# Patient Record
Sex: Male | Born: 1940 | Race: Asian | Hispanic: No | Marital: Married | Smoking: Never smoker
Health system: Southern US, Community
[De-identification: ages and names within clinical notes are randomized; demographics above are authoritative.]

## PROBLEM LIST (undated history)

## (undated) DIAGNOSIS — R55 Syncope and collapse: Secondary | ICD-10-CM

## (undated) HISTORY — PX: APPENDECTOMY: SHX54

## (undated) HISTORY — PX: OTHER SURGICAL HISTORY: SHX169

## (undated) HISTORY — DX: Syncope and collapse: R55

---

## 2000-01-14 ENCOUNTER — Emergency Department (HOSPITAL_COMMUNITY): Admission: EM | Admit: 2000-01-14 | Discharge: 2000-01-14 | Payer: Self-pay | Admitting: Emergency Medicine

## 2000-01-17 ENCOUNTER — Ambulatory Visit (HOSPITAL_COMMUNITY): Admission: RE | Admit: 2000-01-17 | Discharge: 2000-01-17 | Payer: Self-pay | Admitting: Emergency Medicine

## 2000-01-23 ENCOUNTER — Ambulatory Visit (HOSPITAL_COMMUNITY): Admission: EM | Admit: 2000-01-23 | Discharge: 2000-01-23 | Payer: Self-pay | Admitting: Emergency Medicine

## 2000-01-28 ENCOUNTER — Emergency Department (HOSPITAL_COMMUNITY): Admission: EM | Admit: 2000-01-28 | Discharge: 2000-01-28 | Payer: Self-pay | Admitting: Emergency Medicine

## 2003-01-13 ENCOUNTER — Emergency Department (HOSPITAL_COMMUNITY): Admission: EM | Admit: 2003-01-13 | Discharge: 2003-01-14 | Payer: Self-pay | Admitting: Emergency Medicine

## 2003-01-13 ENCOUNTER — Encounter: Payer: Self-pay | Admitting: Emergency Medicine

## 2008-04-04 ENCOUNTER — Emergency Department (HOSPITAL_COMMUNITY): Admission: EM | Admit: 2008-04-04 | Discharge: 2008-04-04 | Payer: Self-pay | Admitting: Emergency Medicine

## 2010-11-05 ENCOUNTER — Emergency Department (HOSPITAL_COMMUNITY): Payer: Medicare Other

## 2010-11-05 ENCOUNTER — Ambulatory Visit (HOSPITAL_COMMUNITY)
Admission: EM | Admit: 2010-11-05 | Discharge: 2010-11-05 | Payer: Medicare Other | Attending: Orthopedic Surgery | Admitting: Orthopedic Surgery

## 2010-11-05 ENCOUNTER — Ambulatory Visit (HOSPITAL_COMMUNITY)
Admission: EM | Admit: 2010-11-05 | Discharge: 2010-11-05 | Disposition: A | Discharge status: Other Acute Inpatient Hospital | Payer: Medicare Other | Source: Home / Self Care | Attending: Emergency Medicine | Admitting: Emergency Medicine

## 2010-11-05 DIAGNOSIS — Z01818 Encounter for other preprocedural examination: Secondary | ICD-10-CM | POA: Insufficient documentation

## 2010-11-05 DIAGNOSIS — F411 Generalized anxiety disorder: Secondary | ICD-10-CM | POA: Insufficient documentation

## 2010-11-05 DIAGNOSIS — J449 Chronic obstructive pulmonary disease, unspecified: Secondary | ICD-10-CM | POA: Insufficient documentation

## 2010-11-05 DIAGNOSIS — W28XXXA Contact with powered lawn mower, initial encounter: Secondary | ICD-10-CM | POA: Insufficient documentation

## 2010-11-05 DIAGNOSIS — S62639B Displaced fracture of distal phalanx of unspecified finger, initial encounter for open fracture: Secondary | ICD-10-CM | POA: Insufficient documentation

## 2010-11-05 DIAGNOSIS — Y93H9 Activity, other involving exterior property and land maintenance, building and construction: Secondary | ICD-10-CM | POA: Insufficient documentation

## 2010-11-05 DIAGNOSIS — Z01812 Encounter for preprocedural laboratory examination: Secondary | ICD-10-CM | POA: Insufficient documentation

## 2010-11-05 DIAGNOSIS — IMO0002 Reserved for concepts with insufficient information to code with codable children: Secondary | ICD-10-CM | POA: Insufficient documentation

## 2010-11-05 DIAGNOSIS — Z79899 Other long term (current) drug therapy: Secondary | ICD-10-CM | POA: Insufficient documentation

## 2010-11-05 DIAGNOSIS — Y92009 Unspecified place in unspecified non-institutional (private) residence as the place of occurrence of the external cause: Secondary | ICD-10-CM | POA: Insufficient documentation

## 2010-11-05 DIAGNOSIS — I10 Essential (primary) hypertension: Secondary | ICD-10-CM | POA: Insufficient documentation

## 2010-11-05 DIAGNOSIS — J4489 Other specified chronic obstructive pulmonary disease: Secondary | ICD-10-CM | POA: Insufficient documentation

## 2010-11-05 DIAGNOSIS — K219 Gastro-esophageal reflux disease without esophagitis: Secondary | ICD-10-CM | POA: Insufficient documentation

## 2010-11-05 DIAGNOSIS — Z01810 Encounter for preprocedural cardiovascular examination: Secondary | ICD-10-CM | POA: Insufficient documentation

## 2010-11-05 DIAGNOSIS — Y998 Other external cause status: Secondary | ICD-10-CM | POA: Insufficient documentation

## 2010-11-05 LAB — BASIC METABOLIC PANEL
CO2: 25 mEq/L (ref 19–32)
Calcium: 9.5 mg/dL (ref 8.4–10.5)
Creatinine, Ser: 0.95 mg/dL (ref 0.50–1.35)
Glucose, Bld: 140 mg/dL — ABNORMAL HIGH (ref 70–99)

## 2010-11-05 LAB — CBC
HCT: 42.8 % (ref 39.0–52.0)
MCHC: 34.3 g/dL (ref 30.0–36.0)
MCV: 97.5 fL (ref 78.0–100.0)

## 2010-11-05 LAB — SURGICAL PCR SCREEN
MRSA, PCR: NEGATIVE
Staphylococcus aureus: NEGATIVE

## 2010-12-11 NOTE — Op Note (Signed)
  NAMEBASSEM, BERNASCONI                     ACCOUNT NO.:  [id number]  MEDICAL RECORD NO.:  [id number]  LOCATION:  [facility]                         FACILITY:  [facility]  PHYSICIAN:  Artist Pais. Harol Shabazz, M.D.DATE OF BIRTH:  30-Oct-1940  DATE OF PROCEDURE:  11/05/2010 DATE OF DISCHARGE:                              OPERATIVE REPORT   PREOPERATIVE DIAGNOSIS:  Open injuries, right long, ring and small fingers.  POSTOPERATIVE DIAGNOSIS:  Open injuries, right long, ring and small fingers.  PROCEDURE:  Revision amputation with a volar flaps, right long and right ring finger and repair of complex wound, right small finger.  SURGEON:  Artist Pais. Mina Marble, MD  ASSISTANT:  None.  ANESTHESIA:  General.  TOURNIQUET TIME:  26 minutes.  No complication.  No drains.  The patient was taken to the operating suite.  After induction of general anesthesia, right upper extremity was prepped and draped in sterile fashion.  An Esmarch was used to exsanguinate the limb. Tourniquet was inflated to 250 mmHg.  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  Rongeurs were used to obtain smooth edges and a contour tip to the middle phalanx, bony architecture of the long and ring finger.  Neurectomy was performed bilaterally and then volar flaps were raised to cover the exposed bone. This was sutured with 4-0 Vicryl Rapide for these 2 fingers.  The small finger had a small complex laceration volarly that was debrided and loosely closed with 4-0 Vicryl Rapide.  All 3 fingers were then dressed with Xeroform, 4x4s and Coban wrap.  The patient tolerated the procedure well and went to recovery room in stable fashion.     Artist Pais Mina Marble, M.D.     MAW/MEDQ  D:  11/05/2010  T:  11/06/2010  Job:  [id number]  Electronically Signed by Dairl Ponder M.D. on 12/11/2010 08:34:59 PM

## 2010-12-11 NOTE — Consult Note (Signed)
  Shane Melton, Shane Melton                     ACCOUNT NO.:  [id number]  MEDICAL RECORD NO.:  [id number]  LOCATION:  [facility]                         FACILITY:  [facility]  PHYSICIAN:  Artist Pais. Nakkia Mackiewicz, M.D.DATE OF BIRTH:  01-13-1941  DATE OF CONSULTATION:  11/05/2010 DATE OF DISCHARGE:                                CONSULTATION   REASON FOR CONSULTATION:  This is a 70 year old right-hand-dominant male who unfortunately got his right hand stuck under a moving lawn mower and presents today with open injuries to his long, ring and small fingers in the dominant right hand.  He is 69.  ALLERGIES:  He says he has no known drug allergies except possibly CODEINE.  MEDICATIONS:  He is not taking any current medications.  PAST MEDICAL/SURGICAL HISTORY:  No recent hospitalizations or surgery.  FAMILY HISTORY:  Noncontributory.  SOCIAL HISTORY:  Noncontributory.  This is obtained through an interpreter who is a friend of the family.  PHYSICAL EXAMINATION:  Reveals obvious grossly contaminated open injuries to the long, ring and small fingers with DIP level amputations to the long and ring.  Complex wound on the small finger.  X-rays confirmed this.  IMPRESSION:  This is a 70 year old male with open injuries on his dominant right hand to the long, ring and small fingers.  I discussed with him.  At this point in time, I recommend, since he has grossly contaminate open wounds, an emergent trip to the operating room.  [facility] OR will not be able to accommodate until 9 o'clock.  Therefore, the patient was going to be discharged from the [facility] Room since he has had no narcotic pain medication, just a local block and he will be driven in a private vehicle for a short stay at [facility].  We will go and take care of this in the operating room at [facility] where there is availability.     Artist Pais Mina Marble, M.D.     MAW/MEDQ  D:  11/05/2010  T:  11/06/2010   Job:  [id number]  Electronically Signed by Dairl Ponder M.D. on 12/11/2010 08:34:56 PM

## 2011-10-22 ENCOUNTER — Ambulatory Visit (INDEPENDENT_AMBULATORY_CARE_PROVIDER_SITE_OTHER): Payer: Medicare Other

## 2011-10-22 ENCOUNTER — Ambulatory Visit: Payer: Medicare Other

## 2011-10-22 ENCOUNTER — Ambulatory Visit (INDEPENDENT_AMBULATORY_CARE_PROVIDER_SITE_OTHER): Payer: Medicare Other | Admitting: Family Medicine

## 2011-10-22 VITALS — BP 126/77 | HR 63 | Temp 98.1°F | Resp 16 | Ht 64.5 in | Wt 146.6 lb

## 2011-10-22 DIAGNOSIS — Z789 Other specified health status: Secondary | ICD-10-CM

## 2011-10-22 DIAGNOSIS — L259 Unspecified contact dermatitis, unspecified cause: Secondary | ICD-10-CM

## 2011-10-22 DIAGNOSIS — R05 Cough: Secondary | ICD-10-CM

## 2011-10-22 DIAGNOSIS — L309 Dermatitis, unspecified: Secondary | ICD-10-CM

## 2011-10-22 DIAGNOSIS — Z719 Counseling, unspecified: Secondary | ICD-10-CM

## 2011-10-22 DIAGNOSIS — Z609 Problem related to social environment, unspecified: Secondary | ICD-10-CM

## 2011-10-22 NOTE — Progress Notes (Signed)
   Patient Name: Shane Melton Date of Birth: 05-08-1941 Medical Record Number: [id number] Gender: male Date of Encounter: 10/22/2011  History of Present Illness:  Shane Melton is a 71 y.o. very pleasant male patient who presents with the following:  He would like to have a "diabetes test."  He does not speak [place]- family interprets for him today.   He has also had a cough for 2 or 3 years.  He has not tried any medications for this.  He sometimes coughs up phlegm.  He is concerned that he may have "neck cancer-" his mother had some sort of head and neck cancer.  He does not have any fever or other symptoms.  He also has a history of eczema on his legs and would like to see a dermatologist.    There is no problem list on file for this patient.  No past medical history on file. No past surgical history on file. History  Substance Use Topics  . Smoking status: Never Smoker   . Smokeless tobacco: Not on file  . Alcohol Use: Not on file   No family history on file. No Known Allergies  Medication list has been reviewed and updated.  Prior to Admission medications   Not on File    Review of Systems:  As per HPI- otherwise negative.   Physical Examination: Filed Vitals:   10/22/11 1222  BP: 126/77  Pulse: 63  Temp: 98.1 F (36.7 C)  Resp: 16   Filed Vitals:   10/22/11 1222  Height: 5' 4.5" (1.638 m)  Weight: 146 lb 9.6 oz (66.497 kg)   Body mass index is 24.78 kg/(m^2).  GEN: WDWN, NAD, Non-toxic, A & O x 3 HEENT: Atraumatic, Normocephalic. Neck supple. No masses, No LAD.  PEERL, TM and oropharynx wnl.   Ears and Nose: No external deformity. CV: RRR, No M/G/R. No JVD. No thrill. No extra heart sounds. PULM: CTA B, no wheezes, crackles, rhonchi. No retractions. No resp. distress. No accessory muscle use. ABD: S, NT, ND, +BS. No rebound. No HSM. EXTR: No c/c/e NEURO Normal gait.  PSYCH: Normally interactive. Conversant. Not depressed or anxious appearing.  Calm  demeanor.   [facility] reading (PRIMARY) by  Dr. Patsy Lager.  CXR: blunting left costophrenic angle vs. elevation of left hemidiaphragm   Results for orders placed in visit on 10/22/11  POCT GLYCOSYLATED HEMOGLOBIN (HGB A1C)      Component Value Range   Hemoglobin A1C 4.9      Assessment and Plan: 1. Language Barrier    2. Cough  DG Chest 2 View  3. Counseling NOS  POCT glycosylated hemoglobin (Hb A1C)  4. Eczema  Ambulatory referral to Dermatology   Reassured that he does not have DM.  I am not sure what type of head/ neck cancer his mother had, but these are generally not genetic and she should not be at increased risk.  He has never used tobacco.   Await over-read of CXR.  Suspect that he may have allergies causing his cough and suggest trial or OTC claritin.  Will refer to dermatology   Abbe Amsterdam, MD 10/22/2011 12:27 PM nd

## 2011-10-23 ENCOUNTER — Encounter: Payer: Self-pay | Admitting: Family Medicine

## 2011-11-13 ENCOUNTER — Ambulatory Visit (INDEPENDENT_AMBULATORY_CARE_PROVIDER_SITE_OTHER): Payer: Medicare Other | Admitting: Emergency Medicine

## 2011-11-13 VITALS — BP 128/78 | HR 67 | Temp 97.3°F | Resp 16 | Ht 64.5 in | Wt 151.2 lb

## 2011-11-13 DIAGNOSIS — Z23 Encounter for immunization: Secondary | ICD-10-CM

## 2011-11-13 DIAGNOSIS — Z7184 Encounter for health counseling related to travel: Secondary | ICD-10-CM

## 2011-11-13 DIAGNOSIS — Z7189 Other specified counseling: Secondary | ICD-10-CM

## 2011-11-13 MED ORDER — IMMUNE GLOBULIN (HUMAN) IM INJ
0.0200 mL/kg | INJECTION | Freq: Once | INTRAMUSCULAR | Status: AC
  Administered 2011-11-13: 1.4 mL via INTRAMUSCULAR

## 2011-11-13 MED ORDER — TYPHOID VACCINE PO CPDR: 1.0000 | DELAYED_RELEASE_CAPSULE | ORAL | Status: AC

## 2011-11-13 MED ORDER — TETANUS TOXOID ADSORBED 5 LFU IM SOLN
0.5000 mL | Freq: Once | INTRAMUSCULAR | Status: AC
  Administered 2011-11-13: 0.5 mL via INTRAMUSCULAR

## 2011-11-13 MED ORDER — ATOVAQUONE-PROGUANIL HCL 250-100 MG PO TABS: 1.0000 | ORAL_TABLET | Freq: Every day | ORAL | Status: DC

## 2011-11-13 NOTE — Progress Notes (Signed)
    Patient Name: Shane Melton Date of Birth: 09/03/1940 Medical Record Number: [id number] Gender: male Date of Encounter: 11/13/2011  History of Present Illness:  Shane Melton is a 71 y.o. 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   There is no problem list on file for this patient.  No past medical history on file. No past surgical history on file. History  Substance Use Topics  . Smoking status: Never Smoker   . Smokeless tobacco: Not on file  . Alcohol Use: Not on file   No family history on file. No Known Allergies  Medication list has been reviewed and updated.  Prior to Admission medications   Not on File    Review of Systems:  As per HPI, otherwise negative.    Physical Examination: Filed Vitals:   11/13/11 0811  BP: 128/78  Pulse: 67  Temp: 97.3 F (36.3 C)  Resp: 16   Filed Vitals:   11/13/11 0811  Height: 5' 4.5" (1.638 m)  Weight: 151 lb 3.2 oz (68.584 kg)   Body mass index is 25.55 kg/(m^2). Ideal Body Weight: Weight in (lb) to have BMI = 25: 147.6    GEN: WDWN, NAD, Non-toxic, Alert & Oriented x 3 HEENT: Atraumatic, Normocephalic.  Ears and Nose: No external deformity. EXTR: No clubbing/cyanosis/edema NEURO: Normal gait.  PSYCH: Normally interactive. Conversant. Not depressed or anxious appearing.  Calm demeanor.    Assessment and Plan: Travel counseling and immunizations  Carmelina Dane, MD

## 2013-06-29 ENCOUNTER — Ambulatory Visit (INDEPENDENT_AMBULATORY_CARE_PROVIDER_SITE_OTHER): Payer: Medicare Other | Admitting: Internal Medicine

## 2013-06-29 VITALS — BP 108/60 | HR 73 | Temp 97.7°F | Resp 16 | Ht 64.5 in | Wt 154.0 lb

## 2013-06-29 DIAGNOSIS — L309 Dermatitis, unspecified: Secondary | ICD-10-CM | POA: Insufficient documentation

## 2013-06-29 DIAGNOSIS — L259 Unspecified contact dermatitis, unspecified cause: Secondary | ICD-10-CM

## 2013-06-29 MED ORDER — PREDNISONE 20 MG PO TABS: ORAL_TABLET | ORAL | Status: DC

## 2013-06-29 MED ORDER — TRIAMCINOLONE 0.1 % CREAM:EUCERIN CREAM 1:1: 1.0000 "application " | TOPICAL_CREAM | Freq: Two times a day (BID) | CUTANEOUS | Status: DC

## 2013-06-29 MED ORDER — CETIRIZINE HCL 10 MG PO TABS: 10.0000 mg | ORAL_TABLET | Freq: Every day | ORAL | Status: DC

## 2013-06-29 NOTE — Progress Notes (Deleted)
   Subjective:    Patient ID: Shane BaileyHee Young Melton, male    DOB: 1940-06-24, 73 y.o.   MRN: [id number]  HPI    Review of Systems     Objective:   Physical Exam        Assessment & Plan:

## 2013-06-29 NOTE — Patient Instructions (Signed)
Eczema Eczema, also called atopic dermatitis, is a skin disorder that causes inflammation of the skin. It causes a red rash and dry, scaly skin. The skin becomes very itchy. Eczema is generally worse during the cooler winter months and often improves with the warmth of summer. Eczema usually starts showing signs in infancy. Some children outgrow eczema, but it may last through adulthood.  CAUSES  The exact cause of eczema is not known, but it appears to run in families. People with eczema often have a family history of eczema, allergies, asthma, or hay fever. Eczema is not contagious. Flare-ups of the condition may be caused by:   Contact with something you are sensitive or allergic to.   Stress. SIGNS AND SYMPTOMS  Dry, scaly skin.   Red, itchy rash.   Itchiness. This may occur before the skin rash and may be very intense.  DIAGNOSIS  The diagnosis of eczema is usually made based on symptoms and medical history. TREATMENT  Eczema cannot be cured, but symptoms usually can be controlled with treatment and other strategies. A treatment plan might include:  Controlling the itching and scratching.   Use over-the-counter antihistamines as directed for itching. This is especially useful at night when the itching tends to be worse.   Use over-the-counter steroid creams as directed for itching.   Avoid scratching. Scratching makes the rash and itching worse. It may also result in a skin infection (impetigo) due to a break in the skin caused by scratching.   Keeping the skin well moisturized with creams every day. This will seal in moisture and help prevent dryness. Lotions that contain alcohol and water should be avoided because they can dry the skin.   Limiting exposure to things that you are sensitive or allergic to (allergens).   Recognizing situations that cause stress.   Developing a plan to manage stress.  HOME CARE INSTRUCTIONS   Only take over-the-counter or  prescription medicines as directed by your health care provider.   Do not use anything on the skin without checking with your health care provider.   Keep baths or showers short (5 minutes) in warm (not hot) water. Use mild cleansers for bathing. These should be unscented. You may add nonperfumed bath oil to the bath water. It is best to avoid soap and bubble bath.   Immediately after a bath or shower, when the skin is still damp, apply a moisturizing ointment to the entire body. This ointment should be a petroleum ointment. This will seal in moisture and help prevent dryness. The thicker the ointment, the better. These should be unscented.   Keep fingernails cut short. Children with eczema may need to wear soft gloves or mittens at night after applying an ointment.   Dress in clothes made of cotton or cotton blends. Dress lightly, because heat increases itching.   A child with eczema should stay away from anyone with fever blisters or cold sores. The virus that causes fever blisters (herpes simplex) can cause a serious skin infection in children with eczema. SEEK MEDICAL CARE IF:   Your itching interferes with sleep.   Your rash gets worse or is not better within 1 week after starting treatment.   You see pus or soft yellow scabs in the rash area.   You have a fever.   You have a rash flare-up after contact with someone who has fever blisters.  Document Released: 05/03/2000 Document Revised: 02/24/2013 Document Reviewed: 12/07/2012 Exit[employer] Patient Information 2014 [employer].  

## 2013-06-29 NOTE — Progress Notes (Signed)
   Subjective:    Patient ID: Shane Melton, male    DOB: December 30, 1940, 73 y.o.   MRN: [id number] This chart was scribed for Shane Siaobert Alga Southall, MD by Danella Maiersaroline Early, ED Scribe. This patient was seen in room 2 and the patient's care was started at 4:11 PM.  Chief Complaint  Patient presents with  . itchy rash all over body since Dec. 2014    HPI HPI Comments: Shane BaileyHee Young Brightbill is a 73 y.o. male who presents to the [facility] complaining of itchy rash to bilateral arms, back, abdomen,and legs onset 2-3 years ago that has worsened since December. Pt states the rash becomes warm to touch, especially at night. Wife states it worsens every winter. Pt has been applying lotion every 12 hours. He denies rash to the face or behind the ears. He is otherwise healthy. He is not on any medications currently. Pt has lived in [place] 20 years. He denies seasonal allergies. Wife denies contacts with similar rash.   History reviewed. No pertinent past medical history.  Current Outpatient Prescriptions on File Prior to Visit  Medication Sig Dispense Refill    none         No Known Allergies   Review of Systems   no current other symptoms     Objective:   Physical Exam  Nursing note and vitals reviewed. Constitutional: He is oriented to person, place, and time. He appears well-developed and well-nourished. No distress.  HENT:  Head: Normocephalic and atraumatic.  Eyes: EOM are normal.  Neck: Neck supple.  Cardiovascular: Normal rate.   Pulmonary/Chest: Effort normal. No respiratory distress.  Musculoskeletal: Normal range of motion.  Neurological: He is alert and oriented to person, place, and time.  Skin: Skin is warm and dry.  Generalized dryness with multiple areas of excoriation. No nodular lesions sugggest of scabies. No vesiculation and no cellulitis. The lesions spare the palms and soles --at their worst on the extremities, dorsal hands   Psychiatric: He has a normal  mood and affect. His behavior is normal.    Filed Vitals:   06/29/13 1525  BP: 108/60  Pulse: 73  Temp: 97.7 F (36.5 C)  TempSrc: Oral  Resp: 16  Height: 5' 4.5" (1.638 m)  Weight: 154 lb (69.854 kg)  SpO2: 97%         Assessment & Plan:  Eczema  Meds ordered this encounter  Medications  . predniSONE (DELTASONE) 20 MG tablet    Sig: 3/3/2/2/1/1 single daily dose for 6 days    Dispense:  12 tablet    Refill:  0  . cetirizine (ZYRTEC) 10 MG tablet    Sig: Take 1 tablet (10 mg total) by mouth daily.    Dispense:  15 tablet    Refill:  0  . Triamcinolone Acetonide (TRIAMCINOLONE 0.1 % CREAM : EUCERIN) CREA    Sig: Apply 1 application topically 2 (two) times daily. For 1 month    Dispense:  1 each    Refill:  1   Ho given Followup one month if not well to consider biopsy of area I have completed the patient encounter in its entirety as documented by the scribe, with editing by me where necessary. Casimiro Lienhard P. Merla Richesoolittle, M.D.

## 2013-07-28 ENCOUNTER — Ambulatory Visit: Payer: Medicare Other

## 2013-07-28 ENCOUNTER — Other Ambulatory Visit: Payer: Self-pay | Admitting: Family Medicine

## 2013-07-28 ENCOUNTER — Ambulatory Visit (INDEPENDENT_AMBULATORY_CARE_PROVIDER_SITE_OTHER): Payer: Medicare Other | Admitting: Family Medicine

## 2013-07-28 VITALS — BP 120/74 | HR 60 | Temp 97.9°F | Resp 16 | Ht 65.5 in

## 2013-07-28 DIAGNOSIS — R0789 Other chest pain: Secondary | ICD-10-CM

## 2013-07-28 DIAGNOSIS — M542 Cervicalgia: Secondary | ICD-10-CM

## 2013-07-28 DIAGNOSIS — L309 Dermatitis, unspecified: Secondary | ICD-10-CM

## 2013-07-28 DIAGNOSIS — R071 Chest pain on breathing: Secondary | ICD-10-CM

## 2013-07-28 LAB — POCT CBC
GRANULOCYTE PERCENT: 44.6 % (ref 37–80)
HEMATOCRIT: 46.7 % (ref 43.5–53.7)
HEMOGLOBIN: 15.3 g/dL (ref 14.1–18.1)
Lymph, poc: 2.2 (ref 0.6–3.4)
MCH: 34.2 pg — AB (ref 27–31.2)
MCHC: 32.8 g/dL (ref 31.8–35.4)
MCV: 104.3 fL — AB (ref 80–97)
MID (cbc): 0.4 (ref 0–0.9)
MPV: 8.8 fL (ref 0–99.8)
PLATELET COUNT, POC: 227 10*3/uL (ref 142–424)
POC Granulocyte: 2.1 (ref 2–6.9)
POC LYMPH PERCENT: 47.7 %L (ref 10–50)
POC MID %: 7.7 %M (ref 0–12)
RBC: 4.48 M/uL — AB (ref 4.69–6.13)
RDW, POC: 13.3 %
WBC: 4.7 10*3/uL (ref 4.6–10.2)

## 2013-07-28 MED ORDER — NAPROXEN 500 MG PO TABS: 500.0000 mg | ORAL_TABLET | Freq: Two times a day (BID) | ORAL | Status: DC

## 2013-07-28 MED ORDER — BETAMETHASONE DIPROPIONATE 0.05 % EX LOTN: TOPICAL_LOTION | Freq: Two times a day (BID) | CUTANEOUS | Status: DC

## 2013-07-28 MED ORDER — TRAMADOL HCL 50 MG PO TABS: 50.0000 mg | ORAL_TABLET | Freq: Three times a day (TID) | ORAL | Status: DC | PRN

## 2013-07-28 MED ORDER — METHYLPREDNISOLONE ACETATE 80 MG/ML IJ SUSP
80.0000 mg | Freq: Once | INTRAMUSCULAR | Status: AC
  Administered 2013-07-28: 80 mg via INTRAMUSCULAR

## 2013-07-28 NOTE — Patient Instructions (Addendum)
Use a little mineral oral on the skin after bathing.  Use the betamethasone lotion on the areas of rash once or twice daily. Only use a small amount.  The Depo-Medrol injection that we gave you will help calm down the inflammation in your ribs and neck, and will help the rash.  Take naproxen 500 mg one twice daily at breakfast and supper for pain and inflammation in the chest wall  Take tramadol only when needed for severe pain. Do not exceed one every 8 hours.  If worse return for recheck  Costochondritis Costochondritis, sometimes called Tietze syndrome, is a swelling and irritation (inflammation) of the tissue (cartilage) that connects your ribs with your breastbone (sternum). It causes pain in the chest and rib area. Costochondritis usually goes away on its own over time. It can take up to 6 weeks or longer to get better, especially if you are unable to limit your activities. CAUSES  Some cases of costochondritis have no known cause. Possible causes include:  Injury (trauma).  Exercise or activity such as lifting.  Severe coughing. SIGNS AND SYMPTOMS  Pain and tenderness in the chest and rib area.  Pain that gets worse when coughing or taking deep breaths.  Pain that gets worse with specific movements. DIAGNOSIS  Your health care provider will do a physical exam and ask about your symptoms. Chest X-rays or other tests may be done to rule out other problems. TREATMENT  Costochondritis usually goes away on its own over time. Your health care provider may prescribe medicine to help relieve pain. HOME CARE INSTRUCTIONS   Avoid exhausting physical activity. Try not to strain your ribs during normal activity. This would include any activities using chest, abdominal, and side muscles, especially if heavy weights are used.  Apply ice to the affected area for the first 2 days after the pain begins.  Put ice in a plastic bag.  Place a towel between your skin and the bag.  Leave  the ice on for 20 minutes, 2 3 times a day.  Only take over-the-counter or prescription medicines as directed by your health care provider. SEEK MEDICAL CARE IF:  You have redness or swelling at the rib joints. These are signs of infection.  Your pain does not go away despite rest or medicine. SEEK IMMEDIATE MEDICAL CARE IF:   Your pain increases or you are very uncomfortable.  You have shortness of breath or difficulty breathing.  You cough up blood.  You have worse chest pains, sweating, or vomiting.  You have a fever or persistent symptoms for more than 2 3 days.  You have a fever and your symptoms suddenly get worse. MAKE SURE YOU:   Understand these instructions.  Will watch your condition.  Will get help right away if you are not doing well or get worse. Document Released: 02/13/2005 Document Revised: 02/24/2013 Document Reviewed: 12/08/2012 [facility] Patient Information 2014 [place], [place].
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
## 2013-08-05 ENCOUNTER — Ambulatory Visit (INDEPENDENT_AMBULATORY_CARE_PROVIDER_SITE_OTHER): Payer: Medicare Other | Admitting: Family Medicine

## 2013-08-05 VITALS — BP 130/80 | HR 70 | Temp 98.0°F | Resp 16 | Ht 64.5 in | Wt 150.0 lb

## 2013-08-05 DIAGNOSIS — M542 Cervicalgia: Secondary | ICD-10-CM

## 2013-08-05 DIAGNOSIS — K3189 Other diseases of stomach and duodenum: Secondary | ICD-10-CM

## 2013-08-05 DIAGNOSIS — R1013 Epigastric pain: Secondary | ICD-10-CM

## 2013-08-05 DIAGNOSIS — B029 Zoster without complications: Secondary | ICD-10-CM

## 2013-08-05 MED ORDER — OMEPRAZOLE 40 MG PO CPDR: DELAYED_RELEASE_CAPSULE | ORAL | Status: DC

## 2013-08-05 MED ORDER — VALACYCLOVIR HCL 1 G PO TABS: 1000.0000 mg | ORAL_TABLET | Freq: Three times a day (TID) | ORAL | Status: DC

## 2013-08-05 MED ORDER — OXYCODONE-ACETAMINOPHEN 5-325 MG PO TABS: 1.0000 | ORAL_TABLET | ORAL | Status: DC | PRN
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
## 2013-08-05 NOTE — Patient Instructions (Addendum)
Shingles Shingles (herpes zoster) is an infection that is caused by the same virus that causes chickenpox (varicella). The infection causes a painful skin rash and fluid-filled blisters, which eventually break open, crust over, and heal. It may occur in any area of the body, but it usually affects only one side of the body or face. The pain of shingles usually lasts about 1 month. However, some people with shingles may develop long-term (chronic) pain in the affected area of the body. Shingles often occurs many years after the person had chickenpox. It is more common:  In people older than 50 years.  In people with weakened immune systems, such as those with HIV, AIDS, or cancer.  In people taking medicines that weaken the immune system, such as transplant medicines.  In people under great stress. CAUSES  Shingles is caused by the varicella zoster virus (VZV), which also causes chickenpox. After a person is infected with the virus, it can remain in the person's body for years in an inactive state (dormant). To cause shingles, the virus reactivates and breaks out as an infection in a nerve root. The virus can be spread from person to person (contagious) through contact with open blisters of the shingles rash. It will only spread to people who have not had chickenpox. When these people are exposed to the virus, they may develop chickenpox. They will not develop shingles. Once the blisters scab over, the person is no longer contagious and cannot spread the virus to others. SYMPTOMS  Shingles shows up in stages. The initial symptoms may be pain, itching, and tingling in an area of the skin. This pain is usually described as burning, stabbing, or throbbing.In a few days or weeks, a painful red rash will appear in the area where the pain, itching, and tingling were felt. The rash is usually on one side of the body in a band or belt-like pattern. Then, the rash usually turns into fluid-filled blisters. They  will scab over and dry up in approximately 2 3 weeks. Flu-like symptoms may also occur with the initial symptoms, the rash, or the blisters. These may include:  Fever.  Chills.  Headache.  Upset stomach. DIAGNOSIS  Your caregiver will perform a skin exam to diagnose shingles. Skin scrapings or fluid samples may also be taken from the blisters. This sample will be examined under a microscope or sent to a lab for further testing. TREATMENT  There is no specific cure for shingles. Your caregiver will likely prescribe medicines to help you manage the pain, recover faster, and avoid long-term problems. This may include antiviral drugs, anti-inflammatory drugs, and pain medicines. HOME CARE INSTRUCTIONS   Take a cool bath or apply cool compresses to the area of the rash or blisters as directed. This may help with the pain and itching.   Only take over-the-counter or prescription medicines as directed by your caregiver.   Rest as directed by your caregiver.  Keep your rash and blisters clean with mild soap and cool water or as directed by your caregiver.  Do not pick your blisters or scratch your rash. Apply an anti-itch cream or numbing creams to the affected area as directed by your caregiver.  Keep your shingles rash covered with a loose bandage (dressing).  Avoid skin contact with:  Babies.   Pregnant women.   Children with eczema.   Elderly people with transplants.   People with chronic illnesses, such as leukemia or AIDS.   Wear loose-fitting clothing to help ease   the pain of material rubbing against the rash.  Keep all follow-up appointments with your caregiver.If the area involved is on your face, you may receive a referral for follow-up to a specialist, such as an eye doctor (ophthalmologist) or an ear, nose, and throat (ENT) doctor. Keeping all follow-up appointments will help you avoid eye complications, chronic pain, or disability.  SEEK IMMEDIATE MEDICAL  CARE IF:   You have facial pain, pain around the eye area, or loss of feeling on one side of your face.  You have ear pain or ringing in your ear.  You have loss of taste.  Your pain is not relieved with prescribed medicines.   Your redness or swelling spreads.   You have more pain and swelling.  Your condition is worsening or has changed.   You have a feveror persistent symptoms for more than 2 3 days.  You have a fever and your symptoms suddenly get worse. MAKE SURE YOU:  Understand these instructions.  Will watch your condition.  Will get help right away if you are not doing well or get worse. Document Released: 05/06/2005 Document Revised: 01/29/2012 Document Reviewed: 12/19/2011 [facility] Patient Information 2014 [place].   Discontinue the naproxen and tramadol  Take the omeprazole 1 pill every night before bedtime for stomach  Oxycodone/APAP one half to one pill every 6 hours when needed for pain  Valacyclovir1000 mg 3 times daily for shingles

## 2013-08-12 ENCOUNTER — Ambulatory Visit (INDEPENDENT_AMBULATORY_CARE_PROVIDER_SITE_OTHER): Payer: Medicare Other | Admitting: Family Medicine

## 2013-08-12 VITALS — BP 122/80 | HR 68 | Temp 97.4°F | Resp 16 | Ht 64.5 in | Wt 152.4 lb

## 2013-08-12 DIAGNOSIS — L508 Other urticaria: Secondary | ICD-10-CM

## 2013-08-12 DIAGNOSIS — L299 Pruritus, unspecified: Secondary | ICD-10-CM

## 2013-08-12 DIAGNOSIS — B86 Scabies: Secondary | ICD-10-CM

## 2013-08-12 DIAGNOSIS — L309 Dermatitis, unspecified: Secondary | ICD-10-CM

## 2013-08-12 DIAGNOSIS — H01139 Eczematous dermatitis of unspecified eye, unspecified eyelid: Secondary | ICD-10-CM

## 2013-08-12 DIAGNOSIS — L500 Allergic urticaria: Secondary | ICD-10-CM

## 2013-08-12 DIAGNOSIS — B029 Zoster without complications: Secondary | ICD-10-CM

## 2013-08-12 DIAGNOSIS — L259 Unspecified contact dermatitis, unspecified cause: Secondary | ICD-10-CM

## 2013-08-12 MED ORDER — HYDROXYZINE HCL 25 MG PO TABS: ORAL_TABLET | ORAL | Status: DC

## 2013-08-12 MED ORDER — RANITIDINE HCL 150 MG PO TABS: ORAL_TABLET | ORAL | Status: DC

## 2013-08-12 MED ORDER — TRIAMCINOLONE ACETONIDE 0.025 % EX LOTN: TOPICAL_LOTION | CUTANEOUS | Status: DC

## 2013-08-12 MED ORDER — IVERMECTIN 3 MG PO TABS: ORAL_TABLET | ORAL | Status: DC

## 2013-08-12 NOTE — Progress Notes (Signed)
Subjective: [name] is here for a recheck. His shingles have continued to heal up. The blisters have begun sloughing off, and he has a lot of popped but still present and trying ligatures around his left abdominal wall. He says it actually the generalized body itching is what bothers him the most. He has little bumps on his arms and back and legs that itch terribly. Some look-alike little punctate scabietic spots, others look like larger areas of eczema. He says if he scratches somewhere starts itching more and he gets the itching all over. That keeps him from being able to sleep at night. He also has been having nonspecific generalized abdominal discomfort. He takes some straining to move his bowels. When he took the prednisone he did better transiently. The topical treatments did not seem to help him feel a lot better, but he has a total body problem with the itching.  Objective: Scattered scabietic looking rash on his arms and legs, with some confluent areas that look more like patches of eczema. The shingles is as noted above, in a drying up stage. His abdomen has normal bowel sounds, soft with mild generalized tenderness.  Assessment: Shingles, improving Pruritus, with scattered diffuse rash probably eczematoid, possibly scabietic Nonspecific abdominal pain with constipation  Plan: Skin biopsy using punch  Procedure note .  one of the places on his right forearm was selected that looks like a little scabietic This was numbed with 1% lidocaine with epinephrine. A 3 mm punch was used to take a sample. This was sent for pathologic analysis. Wound will be healed by just allowing it to heal in secondarily.  Depending on the biopsy report I may need to send him to a dermatologist. In the meanwhile will treat with hydroxyzine, taking ranitidine twice daily, and continuing to use topical steroid. Will treat with ivermectin 6 mg orally for 2 days

## 2013-08-12 NOTE — Patient Instructions (Addendum)
The shingles should gradually continue to heal. If they cause too much pain or discomfort let me know  We will await the results of the punch biopsy and see if that helps decide what to do for the generalized itching. In the meanwhile have prescribed some other medicines for this.  Take the ivermectin 2 pills daily for 2 days in case these little bumps are being caused by something called scabies  Use the lotion of triamcinolone a small amount twice daily on the areas that itch badly  Take hydroxyzine 25 mg 13 times daily as needed for itching. This may make him a little sleepy.  Take a shower less often. Washing removes all the oil from the skin and makes you itch more.  Return in 2 weeks if not much better. I may refer you to a dermatologist before then depending on what the biopsy shows.  Take ranitidine 150 one twice daily for hives  Get some MiraLax and take one dose daily to help the bowels move well. If it causes him to have diarrhea or loose stools stopped taking it.  Scabies Scabies are small bugs (mites) that burrow under the skin and cause red bumps and severe itching. These bugs can only be seen with a microscope. Scabies are highly contagious. They can spread easily from person to person by direct contact. They are also spread through sharing clothing or linens that have the scabies mites living in them. It is not unusual for an entire family to become infected through shared towels, clothing, or bedding.  HOME CARE INSTRUCTIONS   Your caregiver may prescribe a cream or lotion to kill the mites. If cream is prescribed, massage the cream into the entire body from the neck to the bottom of both feet. Also massage the cream into the scalp and face if your child is less than 73 year old. Avoid the eyes and mouth. Do not wash your hands after application.  Leave the cream on for 8 to 12 hours. Your child should bathe or shower after the 8 to 12 hour application period. Sometimes it is  helpful to apply the cream to your child right before bedtime.  One treatment is usually effective and will eliminate approximately 95% of infestations. For severe cases, your caregiver may decide to repeat the treatment in 1 week. Everyone in your household should be treated with one application of the cream.  New rashes or burrows should not appear within 24 to 48 hours after successful treatment. However, the itching and rash may last for 2 to 4 weeks after successful treatment. Your caregiver may prescribe a medicine to help with the itching or to help the rash go away more quickly.  Scabies can live on clothing or linens for up to 3 days. All of your child's recently used clothing, towels, stuffed toys, and bed linens should be washed in hot water and then dried in a dryer for at least 20 minutes on high heat. Items that cannot be washed should be enclosed in a plastic bag for at least 3 days.  To help relieve itching, bathe your child in a cool bath or apply cool washcloths to the affected areas.  Your child may return to school after treatment with the prescribed cream. SEEK MEDICAL CARE IF:   The itching persists longer than 4 weeks after treatment.  The rash spreads or becomes infected. Signs of infection include red blisters or yellow-tan crust. Document Released: 05/06/2005 Document Revised: 07/29/2011 Document Reviewed: 09/14/2008  ExitCare Patient Information 2014 [place], [place]. Hives Hives are itchy, red, swollen areas of the skin. They can vary in size and location on your body. Hives can come and go for hours or several days (acute hives) or for several weeks (chronic hives). Hives do not spread from person to person (noncontagious). They may get worse with scratching, exercise, and emotional stress. CAUSES   Allergic reaction to food, additives, or drugs.  Infections, including the common cold.  Illness, such as vasculitis, lupus, or thyroid disease.  Exposure to  sunlight, heat, or cold.  Exercise.  Stress.  Contact with chemicals. SYMPTOMS   Red or white swollen patches on the skin. The patches may change size, shape, and location quickly and repeatedly.  Itching.  Swelling of the hands, feet, and face. This may occur if hives develop deeper in the skin. DIAGNOSIS  Your caregiver can usually tell what is wrong by performing a physical exam. Skin or blood tests may also be done to determine the cause of your hives. In some cases, the cause cannot be determined. TREATMENT  Mild cases usually get better with medicines such as antihistamines. Severe cases may require an emergency epinephrine injection. If the cause of your hives is known, treatment includes avoiding that trigger.  HOME CARE INSTRUCTIONS   Avoid causes that trigger your hives.  Take antihistamines as directed by your caregiver to reduce the severity of your hives. Non-sedating or low-sedating antihistamines are usually recommended. Do not drive while taking an antihistamine.  Take any other medicines prescribed for itching as directed by your caregiver.  Wear loose-fitting clothing.  Keep all follow-up appointments as directed by your caregiver. SEEK MEDICAL CARE IF:   You have persistent or severe itching that is not relieved with medicine.  You have painful or swollen joints. SEEK IMMEDIATE MEDICAL CARE IF:   You have a fever.  Your tongue or lips are swollen.  You have trouble breathing or swallowing.  You feel tightness in the throat or chest.  You have abdominal pain. These problems may be the first sign of a life-threatening allergic reaction. Call your local emergency services (911 in [place] MAKE SURE YOU:   Understand these instructions.  Will watch your condition.  Will get help right away if you are not doing well or get worse. Document Released: 05/06/2005 Document Revised: 11/05/2011 Document Reviewed: 07/30/2011 [facility] Patient Information  2014 [place], [place].

## 2013-08-25 ENCOUNTER — Other Ambulatory Visit: Payer: Self-pay | Admitting: Family Medicine

## 2013-09-13 ENCOUNTER — Ambulatory Visit (INDEPENDENT_AMBULATORY_CARE_PROVIDER_SITE_OTHER): Payer: Medicare Other | Admitting: Family Medicine

## 2013-09-13 VITALS — BP 118/68 | HR 95 | Temp 98.1°F | Resp 16 | Ht 64.0 in | Wt 155.0 lb

## 2013-09-13 DIAGNOSIS — R109 Unspecified abdominal pain: Secondary | ICD-10-CM

## 2013-09-13 DIAGNOSIS — R21 Rash and other nonspecific skin eruption: Secondary | ICD-10-CM

## 2013-09-13 DIAGNOSIS — B0229 Other postherpetic nervous system involvement: Secondary | ICD-10-CM

## 2013-09-13 DIAGNOSIS — B0223 Postherpetic polyneuropathy: Secondary | ICD-10-CM

## 2013-09-13 DIAGNOSIS — L299 Pruritus, unspecified: Secondary | ICD-10-CM

## 2013-09-13 MED ORDER — HYDROXYZINE HCL 25 MG PO TABS: ORAL_TABLET | ORAL | Status: DC

## 2013-09-13 NOTE — Patient Instructions (Signed)
Use some over-the-counter Zostrix or the stronger version Zostrix HP on the shingles rash. Follow the instructions on the box.  If the pain gets worse, then we can prescribe a medicine like gabapentin (Neurontin) which is a pill that you would have to take regularly to try and relieve nerve pain.  The rash itself should not get worse. If you think it is getting worse, please come back.

## 2013-09-13 NOTE — Progress Notes (Signed)
Subjective: Patient is here for recheck of his shingles. Another [place] told them about someone who died from shingles and he is worried about that. His daughter translates for him and I spoke directly to have some. He's been having more burning pain in the hand rash around his left flank. He is concerned about that. He also continues to have total body itching. The hydroxyzine seems to help that but the pharmacy wouldn't refill it although it should have had one refill left. He still gets some burning in his abdomen from time to time. We talked about his various medications.  Objective: The shingles is now off pass all the blisters days. The rash/scarring is very prominent. He no longer has hives anywhere.  Assessment: Postherpetic neuralgia Nonspecific abdominal burning intermittently Hives, improved, but persisting pruritus  Plan: Continue the hydroxyzine which I refilled Try some Zostrix on the shingles area If the stomach it bothering him he is to return for further assessment.

## 2013-09-28 ENCOUNTER — Telehealth: Payer: Self-pay

## 2013-09-28 NOTE — Telephone Encounter (Signed)
PA needed for hydroxyzine. Completed form on covermymeds.

## 2013-09-29 NOTE — Telephone Encounter (Signed)
PA approved through 09/29/14. Notified pharm.

## 2013-11-06 ENCOUNTER — Other Ambulatory Visit: Payer: Self-pay | Admitting: Family Medicine

## 2013-12-28 ENCOUNTER — Ambulatory Visit (INDEPENDENT_AMBULATORY_CARE_PROVIDER_SITE_OTHER): Payer: Medicare Other | Admitting: Family Medicine

## 2013-12-28 VITALS — BP 104/64 | HR 68 | Temp 98.1°F | Resp 18 | Ht 64.5 in | Wt 153.4 lb

## 2013-12-28 DIAGNOSIS — M542 Cervicalgia: Secondary | ICD-10-CM

## 2013-12-28 DIAGNOSIS — L309 Dermatitis, unspecified: Secondary | ICD-10-CM

## 2013-12-28 DIAGNOSIS — L259 Unspecified contact dermatitis, unspecified cause: Secondary | ICD-10-CM

## 2013-12-28 DIAGNOSIS — L299 Pruritus, unspecified: Secondary | ICD-10-CM

## 2013-12-28 DIAGNOSIS — R071 Chest pain on breathing: Secondary | ICD-10-CM

## 2013-12-28 DIAGNOSIS — R0789 Other chest pain: Secondary | ICD-10-CM

## 2013-12-28 LAB — POCT CBC
GRANULOCYTE PERCENT: 49.4 % (ref 37–80)
HEMATOCRIT: 47.8 % (ref 43.5–53.7)
Hemoglobin: 15.3 g/dL (ref 14.1–18.1)
LYMPH, POC: 2.4 (ref 0.6–3.4)
MCH, POC: 33.5 pg — AB (ref 27–31.2)
MCHC: 32 g/dL (ref 31.8–35.4)
MCV: 104.5 fL — AB (ref 80–97)
MID (CBC): 0.4 (ref 0–0.9)
MPV: 7.5 fL (ref 0–99.8)
PLATELET COUNT, POC: 182 10*3/uL (ref 142–424)
POC GRANULOCYTE: 2.7 (ref 2–6.9)
POC LYMPH %: 43.1 % (ref 10–50)
POC MID %: 7.5 % (ref 0–12)
RBC: 4.58 M/uL — AB (ref 4.69–6.13)
RDW, POC: 15.4 %
WBC: 5.5 10*3/uL (ref 4.6–10.2)

## 2013-12-28 MED ORDER — HYDROXYZINE HCL 25 MG PO TABS: ORAL_TABLET | ORAL | Status: DC

## 2013-12-28 MED ORDER — TRIAMCINOLONE 0.1 % CREAM:EUCERIN CREAM 1:1: 1.0000 "application " | TOPICAL_CREAM | Freq: Two times a day (BID) | CUTANEOUS | Status: DC

## 2013-12-28 MED ORDER — METHYLPREDNISOLONE ACETATE 80 MG/ML IJ SUSP
120.0000 mg | Freq: Once | INTRAMUSCULAR | Status: AC
  Administered 2013-12-28: 120 mg via INTRAMUSCULAR

## 2013-12-28 NOTE — Patient Instructions (Signed)
Referral is being made to a dermatologist specialist for further evaluation. Someone will call you about the appointment. If you do not hear from someone on this over the next 5-7 days please call back and speak to our referrals desk.  Use the cream on the rash twice daily as needed, a small amount rubbed in  Take the hydroxyzine one or 2 pills 3 times daily as needed for itching  You are getting an injection of 120 mg of Depo-Medrol today to try and help the itching.  Return as needed

## 2013-12-28 NOTE — Progress Notes (Signed)
Subjective: 73 year old man with a recurrence of his generalized rash. Actually it never really went away. It is driving him crazy. He does not speak a lot of [place], but his daughter interprets for him. He scratches at himself constantly.  Objective: Eczematoid dermatitis scattered here and there from his truck to his legs and arms. The shingles is dried up completely and looks fine. It is scarred. The patient has no other symptoms. It does not look like whelps this time though he has had welts other times.  Assessment: Extensive eczematoid dermatitis Itching  Plan: CBC and complete metabolic panel Referral to dermatologist Shot of Depo-Medrol 120 Hydroxyzine Topical triamcinolone plus eucerin Return as needed

## 2013-12-29 LAB — COMPREHENSIVE METABOLIC PANEL
ALK PHOS: 47 U/L (ref 39–117)
ALT: 19 U/L (ref 0–53)
AST: 19 U/L (ref 0–37)
Albumin: 4.3 g/dL (ref 3.5–5.2)
BILIRUBIN TOTAL: 0.5 mg/dL (ref 0.2–1.2)
BUN: 19 mg/dL (ref 6–23)
CO2: 31 mEq/L (ref 19–32)
Calcium: 9.5 mg/dL (ref 8.4–10.5)
Chloride: 101 mEq/L (ref 96–112)
Creat: 0.97 mg/dL (ref 0.50–1.35)
GLUCOSE: 83 mg/dL (ref 70–99)
Potassium: 4.2 mEq/L (ref 3.5–5.3)
SODIUM: 137 meq/L (ref 135–145)
TOTAL PROTEIN: 7.3 g/dL (ref 6.0–8.3)

## 2013-12-30 ENCOUNTER — Encounter: Payer: Self-pay | Admitting: *Deleted

## 2015-03-11 ENCOUNTER — Encounter: Payer: Self-pay | Admitting: *Deleted

## 2015-03-11 NOTE — Congregational Nurse Program (Unsigned)
Congregational Nurse Program Note  Date of Encounter: 03/04/2015  Past Medical History: No past medical history on file.  Encounter Details:   Flu shot given  

## 2015-03-11 NOTE — Congregational Nurse Program (Unsigned)
Congregational Nurse Program Note  Date of Encounter: 03/04/2015  Past Medical History: No past medical history on file.  Encounter Details:     CNP Questionnaire - 03/04/15 0900    Patient Demographics   Is this a new or existing patient? 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 No   Does patient have a medical provider? No   Was a mental health screening completed? (GAINS tool) No   Does patient have dental issues? No   Since previous encounter, have you referred patient for abnormal blood glucose that resulted in a new diagnosis or medication change? No

## 2015-03-14 ENCOUNTER — Telehealth: Payer: Self-pay | Admitting: *Deleted

## 2015-03-14 ENCOUNTER — Ambulatory Visit (INDEPENDENT_AMBULATORY_CARE_PROVIDER_SITE_OTHER): Payer: Medicare Other | Admitting: Emergency Medicine

## 2015-03-14 VITALS — BP 124/76 | HR 72 | Temp 98.2°F | Resp 16 | Ht 65.0 in | Wt 159.0 lb

## 2015-03-14 DIAGNOSIS — R42 Dizziness and giddiness: Secondary | ICD-10-CM

## 2015-03-14 LAB — POCT CBC
GRANULOCYTE PERCENT: 47.3 % (ref 37–80)
HEMATOCRIT: 45.7 % (ref 43.5–53.7)
Hemoglobin: 15.6 g/dL (ref 14.1–18.1)
Lymph, poc: 2.6 (ref 0.6–3.4)
MCH, POC: 34.1 pg — AB (ref 27–31.2)
MCHC: 34 g/dL (ref 31.8–35.4)
MCV: 100.2 fL — AB (ref 80–97)
MID (CBC): 0.4 (ref 0–0.9)
MPV: 7.5 fL (ref 0–99.8)
POC GRANULOCYTE: 2.6 (ref 2–6.9)
POC LYMPH %: 46.1 % (ref 10–50)
POC MID %: 6.6 % (ref 0–12)
Platelet Count, POC: 172 10*3/uL (ref 142–424)
RBC: 4.56 M/uL — AB (ref 4.69–6.13)
RDW, POC: 13.7 %
WBC: 5.6 10*3/uL (ref 4.6–10.2)

## 2015-03-14 LAB — COMPREHENSIVE METABOLIC PANEL
ALBUMIN: 4.3 g/dL (ref 3.6–5.1)
ALT: 13 U/L (ref 9–46)
AST: 17 U/L (ref 10–35)
Alkaline Phosphatase: 61 U/L (ref 40–115)
BILIRUBIN TOTAL: 0.4 mg/dL (ref 0.2–1.2)
BUN: 18 mg/dL (ref 7–25)
CALCIUM: 9.8 mg/dL (ref 8.6–10.3)
CHLORIDE: 101 mmol/L (ref 98–110)
CO2: 28 mmol/L (ref 20–31)
Creat: 0.95 mg/dL (ref 0.70–1.18)
Glucose, Bld: 78 mg/dL (ref 65–99)
POTASSIUM: 4.9 mmol/L (ref 3.5–5.3)
Sodium: 139 mmol/L (ref 135–146)
Total Protein: 7.2 g/dL (ref 6.1–8.1)

## 2015-03-14 NOTE — Telephone Encounter (Signed)
Left message on daughter phone that [facility] [address] at 1030am.

## 2015-03-14 NOTE — Progress Notes (Signed)
Subjective:  Patient ID: Shane Melton, male    DOB: April 11, 1941  Age: 74 y.o. MRN: [id number]  CC: Dizziness   HPI Shane BaileyHee Young Deakin presents   With dizziness. He said that over the last 3 months he's had continual dizziness mostly related to getting up in the morning. He said that it occasionally happens while he is driving. He gets very vertiginous. Has no history of antecedent illness or head injury. No neurologic or visual symptoms. Other dizziness. He has no numbness tingling or weakness in his extremities and speech difficulty or expressive  Difficulties. He has no fever or chills. He denies any head injury. He has no chest pain tightness heaviness pressure shortness of breath or sensation of his heart is racing or skipping a beat associated with these episodes. They're short-lived and spontaneously resolved. Never happen at night and is lying down.  History Detron has no past medical history on file.   He has no past surgical history on file.   His  family history is not on file.  He   reports that he has never smoked. He has never used smokeless tobacco. He reports that he does not drink alcohol or use illicit drugs.  Outpatient Prescriptions Prior to Visit  Medication Sig Dispense Refill  . hydrOXYzine (ATARAX/VISTARIL) 25 MG tablet Take one or two pills 3 times daily as needed for itching (Patient not taking: Reported on 03/14/2015) 90 tablet 5  . Triamcinolone Acetonide (TRIAMCINOLONE 0.1 % CREAM : EUCERIN) CREA Apply 1 application topically 2 (two) times daily. For 1 month (Patient not taking: Reported on 03/14/2015) 1 each 2   No facility-administered medications prior to visit.    Social History   Social History  . Marital Status: Single    Spouse Name: N/A  . Number of Children: N/A  . Years of Education: N/A   Social History Main Topics  . Smoking status: Never Smoker   . Smokeless tobacco: Never Used  . Alcohol Use: No  . Drug Use: No  . Sexual Activity: Not  Asked   Other Topics Concern  . None   Social History Narrative     Review of Systems  Constitutional: Negative for fever, chills and appetite change.  HENT: Negative for congestion, ear pain, postnasal drip, sinus pressure and sore throat.   Eyes: Negative for pain and redness.  Respiratory: Negative for cough, shortness of breath and wheezing.   Cardiovascular: Negative for leg swelling.  Gastrointestinal: Negative for nausea, vomiting, abdominal pain, diarrhea, constipation and blood in stool.  Endocrine: Negative for polyuria.  Genitourinary: Negative for dysuria, urgency, frequency and flank pain.  Musculoskeletal: Negative for gait problem.  Skin: Negative for rash.  Neurological: Positive for dizziness. Negative for weakness and headaches.  Psychiatric/Behavioral: Negative for confusion and decreased concentration. The patient is not nervous/anxious.     Objective:  BP 124/76 mmHg  Pulse 72  Temp(Src) 98.2 F (36.8 C) (Oral)  Resp 16  Ht 5\' 5"  (1.651 m)  Wt 159 lb (72.122 kg)  BMI 26.46 kg/m2  SpO2 93%  Physical Exam  Constitutional: He is oriented to person, place, and time. He appears well-developed and well-nourished. No distress.  HENT:  Head: Normocephalic and atraumatic.  Right Ear: External ear normal.  Left Ear: External ear normal.  Nose: Nose normal.  Eyes: Conjunctivae and EOM are normal. Pupils are equal, round, and reactive to light. No scleral icterus.  Neck: Normal range of motion. Neck supple. No tracheal deviation present.  Cardiovascular: Normal rate, regular rhythm and normal heart sounds.   Pulmonary/Chest: Effort normal. No respiratory distress. He has no wheezes. He has no rales.  Abdominal: He exhibits no mass. There is no tenderness. There is no rebound and no guarding.  Musculoskeletal: He exhibits no edema.  Lymphadenopathy:    He has no cervical adenopathy.  Neurological: He is alert and oriented to person, place, and time.  Coordination normal.  Skin: Skin is warm and dry. No rash noted.  Psychiatric: He has a normal mood and affect. His behavior is normal.      Assessment & Plan:   Loomis was seen today for dizziness.  Diagnoses and all orders for this visit:  Dizziness and giddiness -     POCT CBC -     Comprehensive metabolic panel -     Cancel: MR Brain Wo Contrast; Future -     EKG 12-Lead -     Ambulatory referral to Cardiology -     Cancel: MR Brain W Wo Contrast; Future -     MR Brain W Wo Contrast; Future -     MR Brain W Wo Contrast; Future   I have discontinued Mr. Fiola hydrOXYzine and triamcinolone 0.1 % cream : eucerin.  No orders of the defined types were placed in this encounter.    Appropriate red flag conditions were discussed with the patient as well as actions that should be taken.  Patient expressed his understanding.  Follow-up: Return if symptoms worsen or fail to improve.  Carmelina Dane, MD

## 2015-03-14 NOTE — Patient Instructions (Signed)

## 2015-03-15 ENCOUNTER — Other Ambulatory Visit: Payer: Medicaid Other

## 2015-03-15 ENCOUNTER — Ambulatory Visit (HOSPITAL_COMMUNITY): Payer: Medicaid Other

## 2015-04-03 ENCOUNTER — Ambulatory Visit: Payer: Medicaid Other | Admitting: Cardiovascular Disease

## 2015-04-03 ENCOUNTER — Ambulatory Visit (INDEPENDENT_AMBULATORY_CARE_PROVIDER_SITE_OTHER): Payer: Medicare Other | Admitting: Cardiovascular Disease

## 2015-04-03 ENCOUNTER — Encounter: Payer: Self-pay | Admitting: Cardiovascular Disease

## 2015-04-03 VITALS — BP 122/80 | HR 68 | Ht 65.0 in | Wt 159.0 lb

## 2015-04-03 DIAGNOSIS — R0602 Shortness of breath: Secondary | ICD-10-CM | POA: Diagnosis not present

## 2015-04-03 DIAGNOSIS — R0681 Apnea, not elsewhere classified: Secondary | ICD-10-CM

## 2015-04-03 DIAGNOSIS — R0683 Snoring: Secondary | ICD-10-CM

## 2015-04-03 DIAGNOSIS — R55 Syncope and collapse: Secondary | ICD-10-CM

## 2015-04-03 DIAGNOSIS — R42 Dizziness and giddiness: Secondary | ICD-10-CM

## 2015-04-03 HISTORY — DX: Syncope and collapse: R55

## 2015-04-03 NOTE — Progress Notes (Signed)
Cardiology Office Note   Date:  04/03/2015   ID:  Shane Melton, DOB 1940-06-23, MRN [id number]  PCP:  Janace HoardHOPPER,DAVID, MD  Cardiologist:   Madilyn Hookandolph, Micalah Cabezas P, MD   Chief Complaint  Patient presents with  . Appointment    patient is feeling itchy today. has had some dizziness. shob when very sleepy or fatigue. swelling in hands at the end of the day.. no other complaints.      History of Present Illness: Shane Melton is a 74 y.o. male who presents for follow up on dizziness.  [name] reports episode of dizziness that have been ongoing for 3-4 months.  The episodes last for seconds at a time and are typically triggered by turning his head or opening his mouth.  He denies syncope, chest pain or shortness of breath during these episodes.  He has noted exertional shortness of breath.  [name] followed up with Dr. Thornton PapasJeffrey Anderson on 10/25, at which time he reported episodes of dizziness.  He ordered an MRI of the brain, which has not yet been performed.   [name] notes shortness of breath when sleeping and PND.  He denies orthopnea or lower extremity edema.  He has noted numbness and tingling in his L leg and swelling in his L arm.  He exercises regularly at the gym for one hour each day.  In general he is able to do this without    Past Medical History  Diagnosis Date  . Syncope, near 04/03/2015    Past Surgical History  Procedure Laterality Date  . Appendectomy    . Surgery on finger       No current outpatient prescriptions on file.   No current facility-administered medications for this visit.    Allergies:   Review of patient's allergies indicates no known allergies.    Social History:  The patient  reports that he has never smoked. He has never used smokeless tobacco. He reports that he does not drink alcohol or use illicit drugs.   Family History:  The patient's family history includes Dementia (age of onset: 2680) in his sister; Stomach cancer (age of onset: 3960) in  his brother; Throat cancer in his mother.    ROS:  Please see the history of present illness.   Otherwise, review of systems are positive for none.   All other systems are reviewed and negative.    PHYSICAL EXAM: VS:  BP 122/80 mmHg  Pulse 68  Ht 5\' 5"  (1.651 m)  Wt 72.122 kg (159 lb)  BMI 26.46 kg/m2  SpO2 98% , BMI Body mass index is 26.46 kg/(m^2). GENERAL:  Well appearing HEENT:  Pupils equal round and reactive, fundi not visualized, oral mucosa unremarkable NECK:  No jugular venous distention, waveform within normal limits, carotid upstroke brisk and symmetric, no bruits, no thyromegaly.  No presyncope with carotid massage. LYMPHATICS:  No cervical adenopathy LUNGS:  Clear to auscultation bilaterally HEART:  RRR.  PMI not displaced or sustained,S1 and S2 within normal limits, no S3, no S4, no clicks, no rubs, no murmurs ABD:  Flat, positive bowel sounds normal in frequency in pitch, no bruits, no rebound, no guarding, no midline pulsatile mass, no hepatomegaly, no splenomegaly EXT:  2 plus pulses throughout, no edema, no cyanosis no clubbing SKIN:  No rashes no nodules NEURO:  Cranial nerves II through XII grossly intact, motor grossly intact throughout PSYCH:  Cognitively intact, oriented to person place and time    EKG:  EKG is not ordered today. The ekg ordered 10/25 demonstrates  Sinus rhythm rate 65 bpm.   Recent Labs: 03/14/2015: ALT 13; BUN 18; Creat 0.95; Hemoglobin 15.6; Potassium 4.9; Sodium 139    Lipid Panel No results found for: CHOL, TRIG, HDL, CHOLHDL, VLDL, LDLCALC, LDLDIRECT    Wt Readings from Last 3 Encounters:  04/03/15 72.122 kg (159 lb)  03/14/15 72.122 kg (159 lb)  12/28/13 69.582 kg (153 lb 6.4 oz)      ASSESSMENT AND PLAN:  # Dizziness: [name] symptom are concerning for carotid hypersensitivity, given that it occurs when turning his head.  However, he does not have pre-syncope with carotid massage.He is not orthostatic and it does not  sounds like neurocaridogenic syncope.  He denies palpitations and seems unlikely to be due to arrhythmia.  We will obtain an exercise Myoview to rule out ischemia.  We will also get a carotid ultrasound to assess for carotid stenosis.  # Shortness of breath: We will obtain an exercise Myoview as above.  # Apnea: Will obtain a sleep study   Current medicines are reviewed at length with the patient today.  The patient does not have concerns regarding medicines.  The following changes have been made:  no change  Labs/ tests ordered today include:   Orders Placed This Encounter  Procedures  . Myocardial Perfusion Imaging  . Split night study     Disposition:   FU with Zyan Coby C. Duke Salvia, MD in 3 months.   Signed, Madilyn Hook, MD  04/03/2015 5:00 PM    [facility]

## 2015-04-03 NOTE — Patient Instructions (Signed)
  Your physician recommends that you schedule a follow-up appointment in 3 MONTH WITH DR [facility].  [facility].Your physician has recommended that you have a sleep study. This test records several body functions during sleep, including: brain activity, eye movement, oxygen and carbon dioxide blood levels, heart rate and rhythm, breathing rate and rhythm, the flow of air through your mouth and nose, snoring, body muscle movements, and chest and belly movement.  Your physician has requested that you have en exercise stress myoview- [facility]. For further information please visit [email or web address]. Please follow instruction sheet, as given.   Your physician has requested that you have a carotid duplex- [facility]. This test is an ultrasound of the carotid arteries in your neck. It looks at blood flow through these arteries that supply the brain with blood. Allow one hour for this exam. There are no restrictions or special instructions.

## 2015-04-04 ENCOUNTER — Inpatient Hospital Stay: Admission: RE | Admit: 2015-04-04 | Payer: Medicaid Other | Source: Ambulatory Visit

## 2015-04-04 ENCOUNTER — Ambulatory Visit
Admission: RE | Admit: 2015-04-04 | Discharge: 2015-04-04 | Disposition: A | Discharge status: Home / Self Care | Payer: Medicare Other | Source: Ambulatory Visit | Attending: Emergency Medicine | Admitting: Emergency Medicine

## 2015-04-04 DIAGNOSIS — R42 Dizziness and giddiness: Secondary | ICD-10-CM | POA: Diagnosis not present

## 2015-04-04 DIAGNOSIS — R202 Paresthesia of skin: Secondary | ICD-10-CM | POA: Diagnosis not present

## 2015-04-04 MED ORDER — GADOBENATE DIMEGLUMINE 529 MG/ML IV SOLN: 14.0000 mL | Freq: Once | INTRAVENOUS | Status: DC | PRN

## 2015-04-06 ENCOUNTER — Telehealth (HOSPITAL_COMMUNITY): Payer: Self-pay

## 2015-04-06 NOTE — Telephone Encounter (Signed)
Encounter complete. 

## 2015-04-11 ENCOUNTER — Inpatient Hospital Stay (HOSPITAL_COMMUNITY): Admission: RE | Admit: 2015-04-11 | Payer: Medicare Other | Source: Ambulatory Visit

## 2015-04-17 ENCOUNTER — Telehealth (HOSPITAL_COMMUNITY): Payer: Self-pay | Admitting: *Deleted

## 2015-04-17 ENCOUNTER — Inpatient Hospital Stay (HOSPITAL_COMMUNITY): Admission: RE | Admit: 2015-04-17 | Payer: Medicare Other | Source: Ambulatory Visit

## 2015-05-02 DIAGNOSIS — H17823 Peripheral opacity of cornea, bilateral: Secondary | ICD-10-CM | POA: Diagnosis not present

## 2015-05-02 DIAGNOSIS — H2513 Age-related nuclear cataract, bilateral: Secondary | ICD-10-CM | POA: Diagnosis not present

## 2015-05-02 DIAGNOSIS — H40033 Anatomical narrow angle, bilateral: Secondary | ICD-10-CM | POA: Diagnosis not present

## 2015-05-02 DIAGNOSIS — H16223 Keratoconjunctivitis sicca, not specified as Sjogren's, bilateral: Secondary | ICD-10-CM | POA: Diagnosis not present

## 2015-06-13 ENCOUNTER — Ambulatory Visit (HOSPITAL_BASED_OUTPATIENT_CLINIC_OR_DEPARTMENT_OTHER): Payer: Medicare Other | Attending: Cardiovascular Disease

## 2015-06-25 NOTE — Progress Notes (Signed)
This encounter was created in error - please disregard.

## 2015-06-26 ENCOUNTER — Encounter: Payer: Medicare Other | Admitting: Cardiovascular Disease

## 2015-06-27 ENCOUNTER — Encounter: Payer: Self-pay | Admitting: *Deleted

## 2015-09-04 ENCOUNTER — Encounter (HOSPITAL_COMMUNITY): Payer: Self-pay | Admitting: Cardiovascular Disease

## 2016-01-06 ENCOUNTER — Encounter: Payer: Self-pay | Admitting: Family Medicine

## 2016-01-06 ENCOUNTER — Ambulatory Visit (INDEPENDENT_AMBULATORY_CARE_PROVIDER_SITE_OTHER): Payer: Medicare Other | Admitting: Family Medicine

## 2016-01-06 DIAGNOSIS — R42 Dizziness and giddiness: Secondary | ICD-10-CM | POA: Diagnosis not present

## 2016-01-06 DIAGNOSIS — R413 Other amnesia: Secondary | ICD-10-CM

## 2016-01-06 LAB — POCT URINALYSIS DIP (MANUAL ENTRY)
BILIRUBIN UA: NEGATIVE
Bilirubin, UA: NEGATIVE
Glucose, UA: NEGATIVE
LEUKOCYTES UA: NEGATIVE
NITRITE UA: NEGATIVE
PH UA: 5
PROTEIN UA: NEGATIVE
Spec Grav, UA: 1.025
UROBILINOGEN UA: 1

## 2016-01-06 LAB — CBC WITH DIFFERENTIAL/PLATELET
BASOS ABS: 0 {cells}/uL (ref 0–200)
BASOS PCT: 0 %
Eosinophils Absolute: 252 cells/uL (ref 15–500)
Eosinophils Relative: 6 %
HCT: 42.3 % (ref 38.5–50.0)
Hemoglobin: 14.2 g/dL (ref 13.2–17.1)
LYMPHS PCT: 36 %
Lymphs Abs: 1512 cells/uL (ref 850–3900)
MCH: 33.6 pg — ABNORMAL HIGH (ref 27.0–33.0)
MCHC: 33.6 g/dL (ref 32.0–36.0)
MCV: 100 fL (ref 80.0–100.0)
MONOS PCT: 10 %
MPV: 10 fL (ref 7.5–12.5)
Monocytes Absolute: 420 cells/uL (ref 200–950)
NEUTROS ABS: 2016 {cells}/uL (ref 1500–7800)
Neutrophils Relative %: 48 %
Platelets: 199 10*3/uL (ref 140–400)
RBC: 4.23 MIL/uL (ref 4.20–5.80)
RDW: 13.4 % (ref 11.0–15.0)
WBC: 4.2 10*3/uL (ref 3.8–10.8)

## 2016-01-06 LAB — POC MICROSCOPIC URINALYSIS (UMFC)

## 2016-01-06 LAB — COMPLETE METABOLIC PANEL WITH GFR
ALBUMIN: 3.9 g/dL (ref 3.6–5.1)
ALK PHOS: 51 U/L (ref 40–115)
ALT: 12 U/L (ref 9–46)
AST: 16 U/L (ref 10–35)
BUN: 19 mg/dL (ref 7–25)
CALCIUM: 8.6 mg/dL (ref 8.6–10.3)
CO2: 28 mmol/L (ref 20–31)
Chloride: 104 mmol/L (ref 98–110)
Creat: 1.02 mg/dL (ref 0.70–1.18)
GFR, EST AFRICAN AMERICAN: 83 mL/min (ref >=60)
GFR, Est Non African American: 72 mL/min (ref >=60)
Glucose, Bld: 100 mg/dL — ABNORMAL HIGH (ref 65–99)
POTASSIUM: 3.8 mmol/L (ref 3.5–5.3)
Sodium: 138 mmol/L (ref 135–146)
Total Bilirubin: 0.8 mg/dL (ref 0.2–1.2)
Total Protein: 6.5 g/dL (ref 6.1–8.1)

## 2016-01-06 LAB — TSH: TSH: 1.55 m[IU]/L (ref 0.40–4.50)

## 2016-01-06 NOTE — Patient Instructions (Addendum)
Carotid Doppler has been ordered and [facility] be [facility]. We will call you to inform you of your appointment time.  Neurology will contact you to schedule your appointment.  Will follow-up with lab results.  Please report to the emergency room immediately if you experience signs of Stroke.  See the handout attached.   Stroke Prevention Some health problems and behaviors may make it more likely for you to have a stroke. Below are ways to lessen your risk of having a stroke.   Be active for at least 30 minutes on most or all days.  Do not smoke. Try not to be around others who smoke.  Do not drink too much alcohol.  Do not have more than 2 drinks a day if you are a man.  Do not have more than 1 drink a day if you are a woman and are not pregnant.  Eat healthy foods, such as fruits and vegetables. If you were put on a specific diet, follow the diet as told.  Keep your cholesterol levels under control through diet and medicines. Look for foods that are low in saturated fat, trans fat, cholesterol, and are high in fiber.  If you have diabetes, follow all diet plans and take your medicine as told.  Ask your doctor if you need treatment to lower your blood pressure. If you have high blood pressure (hypertension), follow all diet plans and take your medicine as told by your doctor.  If you are 75-567 years old, have your blood pressure checked every 3-5 years. If you are age 75 or older, have your blood pressure checked every year.  Keep a healthy weight. Eat foods that are low in calories, salt, saturated fat, trans fat, and cholesterol.  Do not take drugs.  Avoid birth control pills, if this applies. Talk to your doctor about the risks of taking birth control pills.  Talk to your doctor if you have sleep problems (sleep apnea).  Take all medicine as told by your doctor.  You may be told to take aspirin or blood thinner medicine. Take this medicine as told by your  doctor.  Understand your medicine instructions.  Make sure any other conditions you have are being taken care of. GET HELP RIGHT AWAY IF:  You suddenly lose feeling (you feel numb) or have weakness in your face, arm, or leg.  Your face or eyelid hangs down to one side.  You suddenly feel confused.  You have trouble talking (aphasia) or understanding what people are saying.  You suddenly have trouble seeing in one or both eyes.  You suddenly have trouble walking.  You are dizzy.  You lose your balance or your movements are clumsy (uncoordinated).  You suddenly have a very bad headache and you do not know the cause.  You have new chest pain.  Your heart feels like it is fluttering or skipping a beat (irregular heartbeat). Do not wait to see if the symptoms above go away. Get help right away. Call your local emergency services (911 in [place] Do not drive yourself to the hospital.   This information is not intended to replace advice given to you by your health care provider. Make sure you discuss any questions you have with your health care provider.   Document Released: 11/05/2011 Document Revised: 05/27/2014 Document Reviewed: 11/06/2012 Elsevier Interactive Patient Education 2016 [employer]   IF you received an x-ray today, you will receive an invoice from [facility]. Please contact [facility]  at [phone] with questions or concerns regarding your invoice.   IF you received labwork today, you will receive an invoice from [facility]. Please contact [facility] at [phone] with questions or concerns regarding your invoice.   Our billing staff will not be able to assist you with questions regarding bills from these companies.  You will be contacted with the lab results as soon as they are available. The fastest way to get your results is to activate your My Chart account. Instructions are located on the last page of this  paperwork. If you have not heard from us regarding the results in 2 weeks, please contact this office.

## 2016-01-06 NOTE — Progress Notes (Signed)
Patient ID: Shane Melton, male    DOB: 1940/07/20, 75 y.o.   MRN: [id number]  PCP: Janace HoardHOPPER,DAVID, MD  Chief Complaint  Patient presents with  . Memory Loss    Subjective:   HPI Granddaughter interpreting for patient  Presents for evaluation of memory loss for about 3 years.  He also reports misplacing items frequently and losing track of where he is going when he is traveling. Patient reports on occasion forgetting the route to return home. He resides with his daughter and grandchildren.    Granddaughter reports that the family is not concerned about his memory.  She report that he functions well at home, he does yard work daily, he has never wandered off, he drives himself around, and has had no car accidents.  The patient reports no  headaches. Although he reports persistent dizziness which is increased  with exertion while working in the yard. Reports intact smelling. Reports taste of food and fluid are intact.  Patient has a history of dizziness and near syncope.  He was to have received a cardiac work-up and a carotid doppler but never went to appointments.  . Social History   Social History  . Marital status: Married    Spouse name: N/A  . Number of children: 3  . Years of education: N/A   Occupational History  . Not on file.   Social History Main Topics  . Smoking status: Never Smoker  . Smokeless tobacco: Never Used  . Alcohol use No  . Drug use: No  . Sexual activity: Not on file   Other Topics Concern  . Not on file   Social History Narrative  . No narrative on file    . Family History  Problem Relation Age of Onset  . Throat cancer Mother     laryngeal cancer  . Stomach cancer Brother 7560  . Dementia Sister 2680    Review of Systems  Constitutional: Positive for fatigue.  Respiratory: Negative.   Cardiovascular: Negative.   Neurological: Positive for dizziness. Negative for tremors, syncope, facial asymmetry, speech difficulty, weakness,  numbness and headaches.       Reports memory loss   Patient Active Problem List   Diagnosis Date Noted  . Syncope, near 04/03/2015  . Eczema 06/29/2013    Prior to Admission medications   Not on File   No Known Allergies     Objective:  Physical Exam  Constitutional: He is oriented to person, place, and time. He appears well-developed and well-nourished.  HENT:  Head: Normocephalic and atraumatic.  Right Ear: External ear normal.  Left Ear: External ear normal.  Eyes: Conjunctivae and EOM are normal. Pupils are equal, round, and reactive to light.  Neck: Normal range of motion.  Cardiovascular: Normal rate, regular rhythm, normal heart sounds and intact distal pulses.   Pulmonary/Chest: Effort normal and breath sounds normal.  Musculoskeletal: Normal range of motion.  Neurological: He is alert and oriented to person, place, and time.  Mini-mental screening completed and patient scored 30/30. Cerebellar function intact. Positive Romberg test. Negative for nystagmus. Sensation to fine touch intact.  Skin: Skin is warm and dry.  Psychiatric: He has a normal mood and affect. His behavior is normal. Judgment and thought content normal.    Vitals:   01/06/16 0807  BP: 102/66  Pulse: 69  Resp: 18  Temp: 97.5 F (36.4 C)      Assessment & Plan:  1. Memory loss 2. Dizziness and giddiness  Patient  reports that he is experiencing some memory loss.  He performed 30/30 on the mini-mental status exam.  He does have a hisotry of syncope and continues to experience dizziness since October 2016.  Physical and neurological exam was unremarkable. Given age and duration of dizziness symptoms, further neurological work-up is indicated .  - POCT urinalysis dipstick-negative - POCT Microscopic Urinalysis ([facility])-negative - COMPLETE METABOLIC PANEL WITH GFR-negative - TSH-normal  - CBC with Differential/Platelet-negative  - US Carotid Duplex Bilateral; Future - Ambulatory referral to  Neurology  Godfrey PickKimberly S. Tiburcio PeaHarris, MSN, FNP-C [facility]

## 2016-01-15 ENCOUNTER — Ambulatory Visit
Admission: RE | Admit: 2016-01-15 | Discharge: 2016-01-15 | Disposition: A | Discharge status: Home / Self Care | Payer: Medicare Other | Source: Ambulatory Visit | Attending: Family Medicine | Admitting: Family Medicine

## 2016-01-15 DIAGNOSIS — R413 Other amnesia: Secondary | ICD-10-CM

## 2016-01-15 DIAGNOSIS — I6523 Occlusion and stenosis of bilateral carotid arteries: Secondary | ICD-10-CM | POA: Diagnosis not present

## 2016-01-15 DIAGNOSIS — R42 Dizziness and giddiness: Secondary | ICD-10-CM

## 2016-01-18 ENCOUNTER — Encounter: Payer: Self-pay | Admitting: Family Medicine

## 2016-01-18 NOTE — Progress Notes (Signed)
  Dear [name],  Below are the results from your recent visit were within expected range.  Resulted Orders  US Carotid Duplex Bilateral   Narrative   CLINICAL DATA:  Dizziness, intermittent memory loss  EXAM: BILATERAL CAROTID DUPLEX ULTRASOUND  TECHNIQUE: Wallace CullensGray scale imaging, color Doppler and duplex ultrasound were performed of bilateral carotid and vertebral arteries in the neck.  COMPARISON:  None.  FINDINGS: Criteria: Quantification of carotid stenosis is based on velocity parameters that correlate the residual internal carotid diameter with NASCET-based stenosis levels, using the diameter of the distal internal carotid lumen as the denominator for stenosis measurement.  The following velocity measurements were obtained:  RIGHT  ICA:  82/15 cm/sec  CCA:  97/20 cm/sec  SYSTOLIC ICA/CCA RATIO:  0.8  DIASTOLIC ICA/CCA RATIO:  0.8  ECA:  82 cm/sec  LEFT  ICA:  88/41 cm/sec  CCA:  87/19 cm/sec  SYSTOLIC ICA/CCA RATIO:  1.0  DIASTOLIC ICA/CCA RATIO:  2.2  ECA:  65 cm/sec  RIGHT CAROTID ARTERY: No significant carotid atherosclerosis. Minor intimal thickening. No hemodynamically significant right ICA stenosis, velocity elevation, or turbulent flow. Degree of narrowing less than 50%.  RIGHT VERTEBRAL ARTERY:  Antegrade  LEFT CAROTID ARTERY: No significant carotid atherosclerosis. Minor intimal thickening. No hemodynamically significant left ICA stenosis, velocity elevation, or turbulent flow.  LEFT VERTEBRAL ARTERY:  Antegrade  IMPRESSION: Minor carotid intimal thickening. No significant carotid atherosclerosis or ICA stenosis by ultrasound. Degree of narrowing less than 50% bilaterally.  Patent antegrade vertebral flow bilaterally   Electronically Signed   By: Judie PetitM.  Shick M.D.   On: 01/15/2016 14:52      If you have any questions or concerns, please don't hesitate to call.  Sincerely,   Godfrey PickKimberly S. Tiburcio PeaHarris, MSN, FNP-C [facility]

## 2016-03-12 ENCOUNTER — Ambulatory Visit: Payer: Medicare Other | Admitting: Neurology

## 2016-07-11 ENCOUNTER — Ambulatory Visit (INDEPENDENT_AMBULATORY_CARE_PROVIDER_SITE_OTHER): Payer: Medicare Other | Admitting: Physician Assistant

## 2016-07-11 VITALS — BP 92/58 | HR 83 | Temp 97.3°F | Resp 18 | Ht 65.0 in | Wt 164.0 lb

## 2016-07-11 DIAGNOSIS — J101 Influenza due to other identified influenza virus with other respiratory manifestations: Secondary | ICD-10-CM | POA: Diagnosis not present

## 2016-07-11 DIAGNOSIS — R0981 Nasal congestion: Secondary | ICD-10-CM | POA: Diagnosis not present

## 2016-07-11 DIAGNOSIS — R059 Cough, unspecified: Secondary | ICD-10-CM

## 2016-07-11 DIAGNOSIS — R031 Nonspecific low blood-pressure reading: Secondary | ICD-10-CM | POA: Diagnosis not present

## 2016-07-11 DIAGNOSIS — R05 Cough: Secondary | ICD-10-CM | POA: Diagnosis not present

## 2016-07-11 LAB — POCT CBC
Granulocyte percent: 58.8 %G (ref 37–80)
HCT, POC: 43.1 % — AB (ref 43.5–53.7)
Hemoglobin: 15.2 g/dL (ref 14.1–18.1)
Lymph, poc: 1.6 (ref 0.6–3.4)
MCH, POC: 35.4 pg — AB (ref 27–31.2)
MCHC: 35.3 g/dL (ref 31.8–35.4)
MCV: 100.3 fL — AB (ref 80–97)
MID (cbc): 0.4 (ref 0–0.9)
MPV: 7.7 fL (ref 0–99.8)
POC Granulocyte: 2.9 (ref 2–6.9)
POC LYMPH PERCENT: 32.9 %L (ref 10–50)
POC MID %: 8.3 %M (ref 0–12)
Platelet Count, POC: 184 10*3/uL (ref 142–424)
RBC: 4.3 M/uL — AB (ref 4.69–6.13)
RDW, POC: 14.4 %
WBC: 5 10*3/uL (ref 4.6–10.2)

## 2016-07-11 LAB — POCT INFLUENZA A/B
Influenza A, POC: NEGATIVE
Influenza B, POC: POSITIVE — AB

## 2016-07-11 MED ORDER — PROMETHAZINE-DM 6.25-15 MG/5ML PO SYRP: 5.0000 mL | ORAL_SOLUTION | Freq: Four times a day (QID) | ORAL | 0 refills | Status: DC | PRN

## 2016-07-11 MED ORDER — FLUTICASONE PROPIONATE 50 MCG/ACT NA SUSP: 2.0000 | Freq: Every day | NASAL | 6 refills | Status: DC

## 2016-07-11 MED ORDER — AZITHROMYCIN 250 MG PO TABS: ORAL_TABLET | ORAL | 0 refills | Status: DC

## 2016-07-11 NOTE — Patient Instructions (Addendum)
  You are positive for the flu. You are contagious.  Drink 1-2 liters of water during the day. Warm tea with honey and lemon.  Flonase: 2 sprays each nostril at night before bed and two more sprays in the morning.  Promethazine: Take 5 mLs by mouth 4 (four) times daily as needed for cough.  Azithromycin: Take 2 tablets x 1 dose, then 1 tablet QD x 4 days Please come back if you are not better in 5-7 days.   Thank you for coming in today. I hope you feel we met your needs.  Feel free to call [facility] if you have any questions or further requests.  Please consider signing up for MyChart if you do not already have it, as this is a great way to communicate with me.  Best,  Whitney McVey, PA-C  IF you received an x-ray today, you will receive an invoice from [facility]. Please contact [facility] at [phone] with questions or concerns regarding your invoice.   IF you received labwork today, you will receive an invoice from [facility]. Please contact [facility] at [phone] with questions or concerns regarding your invoice.   Our billing staff will not be able to assist you with questions regarding bills from these companies.  You will be contacted with the lab results as soon as they are available. The fastest way to get your results is to activate your My Chart account. Instructions are located on the last page of this paperwork. If you have not heard from [place] regarding the results in 2 weeks, please contact this office.

## 2016-07-11 NOTE — Progress Notes (Signed)
Shane Melton  MRN: [id number] DOB: 10-11-1940  PCP: Janace HoardHOPPER,DAVID, MD  Subjective:  Pt is a 76 year old male who presents to clinic for cough. He speaks B[place]. Mobile interpreter used today # [id number] He had a mild cold - feels improved. Cough x 3 days. Worse at night.  Is productive +nasal congestion, chest congestion, SOB, chills.  Keeps him up at night. Better during the day.  Denies Fever, chest tightness, chest pain.  He did not have Flu shot this season. He has known sick contacts at church.  Has taken NyQuil - not helping.  Review of Systems  Constitutional: Positive for chills. Negative for diaphoresis and fever.  HENT: Positive for congestion, postnasal drip and rhinorrhea. Negative for sore throat and trouble swallowing.   Respiratory: Positive for cough and shortness of breath. Negative for chest tightness and wheezing.   Cardiovascular: Negative for chest pain and palpitations.  Gastrointestinal: Negative for abdominal pain, diarrhea, nausea and vomiting.  Neurological: Positive for headaches. Negative for dizziness, syncope and light-headedness.  Psychiatric/Behavioral: Positive for sleep disturbance.    Patient Active Problem List   Diagnosis Date Noted  . Memory loss 01/06/2016  . Dizziness and giddiness 01/06/2016  . Syncope, near 04/03/2015  . Eczema 06/29/2013    No current outpatient prescriptions on file prior to visit.   No current facility-administered medications on file prior to visit.     No Known Allergies   Objective:  BP (!) 92/58   Pulse 83   Temp 97.3 F (36.3 C) (Oral)   Resp 18   Ht 5\' 5"  (1.651 m)   Wt 164 lb (74.4 kg)   SpO2 95%   BMI 27.29 kg/m   Physical Exam  Constitutional: He is oriented to person, place, and time and well-developed, well-nourished, and in no distress. No distress.  HENT:  Right Ear: Tympanic membrane normal.  Left Ear: Tympanic membrane normal.  Nose: Mucosal edema and rhinorrhea present. Right sinus  exhibits no maxillary sinus tenderness and no frontal sinus tenderness. Left sinus exhibits no maxillary sinus tenderness and no frontal sinus tenderness.  Mouth/Throat: Mucous membranes are normal. Posterior oropharyngeal edema present. No oropharyngeal exudate or posterior oropharyngeal erythema.  Cardiovascular: Normal rate, regular rhythm and normal heart sounds.   Pulmonary/Chest: Effort normal. He has no wheezes. He has rhonchi in the left lower field. He has no rales.  Neurological: He is alert and oriented to person, place, and time. GCS score is 15.  Skin: Skin is warm and dry.  Psychiatric: Mood, memory, affect and judgment normal.  Vitals reviewed.   Results for orders placed or performed in visit on 07/11/16  POCT Influenza A/B  Result Value Ref Range   Influenza A, POC Negative Negative   Influenza B, POC Positive (A) Negative  POCT CBC  Result Value Ref Range   WBC 5.0 4.6 - 10.2 K/uL   Lymph, poc 1.6 0.6 - 3.4   POC LYMPH PERCENT 32.9 10 - 50 %L   MID (cbc) 0.4 0 - 0.9   POC MID % 8.3 0 - 12 %M   POC Granulocyte 2.9 2 - 6.9   Granulocyte percent 58.8 37 - 80 %G   RBC 4.30 (A) 4.69 - 6.13 M/uL   Hemoglobin 15.2 14.1 - 18.1 g/dL   HCT, POC 40.943.1 (A) 81.143.5 - 53.7 %   MCV 100.3 (A) 80 - 97 fL   MCH, POC 35.4 (A) 27 - 31.2 pg   MCHC 35.3 31.8 -  35.4 g/dL   RDW, POC 16.1 %   Platelet Count, POC 184 142 - 424 K/uL   MPV 7.7 0 - 99.8 fL    Assessment and Plan :  1. Influenza B 2. Cough 3. Low blood pressure reading 4. Nasal congestion - POCT Influenza A/B - POCT CBC - promethazine-dextromethorphan (PROMETHAZINE-DM) 6.25-15 MG/5ML syrup; Take 5 mLs by mouth 4 (four) times daily as needed for cough.  Dispense: 118 mL; Refill: 0 - azithromycin (ZITHROMAX) 250 MG tablet; Take 2 tabs PO x 1 dose, then 1 tab PO QD x 4 days  Dispense: 6 tablet; Refill: 0 - Orthostatic vital signs - fluticasone (FLONASE) 50 MCG/ACT nasal spray; Place 2 sprays into both nostrils daily.   Dispense: 16 g; Refill: 6 - Pt is >48 hours from symptom onset for Tamiflu treatment. Pt reports worsening cough, SOB, chills. Will treat with Zithromax due to physical exam findings considering his age and PNU risk. RTC precautions discussed.    Marco Collie, PA-C  [facility] at [facility] 07/11/2016 9:52 AM

## 2017-03-21 DIAGNOSIS — H01022 Squamous blepharitis right lower eyelid: Secondary | ICD-10-CM | POA: Diagnosis not present

## 2017-03-21 DIAGNOSIS — H01021 Squamous blepharitis right upper eyelid: Secondary | ICD-10-CM | POA: Diagnosis not present

## 2017-03-21 DIAGNOSIS — H16223 Keratoconjunctivitis sicca, not specified as Sjogren's, bilateral: Secondary | ICD-10-CM | POA: Diagnosis not present

## 2017-03-21 DIAGNOSIS — H40033 Anatomical narrow angle, bilateral: Secondary | ICD-10-CM | POA: Diagnosis not present

## 2017-03-21 DIAGNOSIS — H17823 Peripheral opacity of cornea, bilateral: Secondary | ICD-10-CM | POA: Diagnosis not present

## 2017-03-28 DIAGNOSIS — Z0000 Encounter for general adult medical examination without abnormal findings: Secondary | ICD-10-CM | POA: Diagnosis not present

## 2017-03-28 DIAGNOSIS — Z23 Encounter for immunization: Secondary | ICD-10-CM | POA: Diagnosis not present

## 2017-04-17 DIAGNOSIS — E782 Mixed hyperlipidemia: Secondary | ICD-10-CM | POA: Insufficient documentation

## 2017-04-25 DIAGNOSIS — Z23 Encounter for immunization: Secondary | ICD-10-CM | POA: Diagnosis not present

## 2017-04-25 DIAGNOSIS — K5900 Constipation, unspecified: Secondary | ICD-10-CM | POA: Diagnosis not present

## 2017-04-25 DIAGNOSIS — Z0000 Encounter for general adult medical examination without abnormal findings: Secondary | ICD-10-CM | POA: Diagnosis not present

## 2017-04-25 DIAGNOSIS — H9190 Unspecified hearing loss, unspecified ear: Secondary | ICD-10-CM | POA: Diagnosis not present

## 2017-04-25 DIAGNOSIS — J301 Allergic rhinitis due to pollen: Secondary | ICD-10-CM | POA: Diagnosis not present

## 2017-06-05 DIAGNOSIS — H903 Sensorineural hearing loss, bilateral: Secondary | ICD-10-CM | POA: Diagnosis not present

## 2017-08-15 DIAGNOSIS — E782 Mixed hyperlipidemia: Secondary | ICD-10-CM | POA: Diagnosis not present

## 2017-08-15 DIAGNOSIS — K5900 Constipation, unspecified: Secondary | ICD-10-CM | POA: Diagnosis not present

## 2017-08-15 DIAGNOSIS — L209 Atopic dermatitis, unspecified: Secondary | ICD-10-CM | POA: Diagnosis not present

## 2017-11-27 DIAGNOSIS — L209 Atopic dermatitis, unspecified: Secondary | ICD-10-CM | POA: Diagnosis not present

## 2017-11-27 DIAGNOSIS — L299 Pruritus, unspecified: Secondary | ICD-10-CM | POA: Diagnosis not present

## 2017-11-27 DIAGNOSIS — Z23 Encounter for immunization: Secondary | ICD-10-CM | POA: Diagnosis not present

## 2017-11-27 DIAGNOSIS — E782 Mixed hyperlipidemia: Secondary | ICD-10-CM | POA: Diagnosis not present

## 2017-12-25 DIAGNOSIS — L309 Dermatitis, unspecified: Secondary | ICD-10-CM | POA: Diagnosis not present

## 2017-12-25 DIAGNOSIS — L209 Atopic dermatitis, unspecified: Secondary | ICD-10-CM | POA: Diagnosis not present

## 2018-03-31 DIAGNOSIS — H9190 Unspecified hearing loss, unspecified ear: Secondary | ICD-10-CM | POA: Diagnosis not present

## 2018-03-31 DIAGNOSIS — Z23 Encounter for immunization: Secondary | ICD-10-CM | POA: Diagnosis not present

## 2018-03-31 DIAGNOSIS — E782 Mixed hyperlipidemia: Secondary | ICD-10-CM | POA: Diagnosis not present

## 2018-05-01 DIAGNOSIS — E782 Mixed hyperlipidemia: Secondary | ICD-10-CM | POA: Diagnosis not present

## 2018-05-01 DIAGNOSIS — Z0000 Encounter for general adult medical examination without abnormal findings: Secondary | ICD-10-CM | POA: Diagnosis not present

## 2018-05-17 IMAGING — US US CAROTID DUPLEX BILAT
1 series · 13 of 24 positions shown · non-contrast
Comparison: None.

CLINICAL DATA: Dizziness, intermittent memory loss

EXAM:
BILATERAL CAROTID DUPLEX ULTRASOUND
TECHNIQUE: Gray scale imaging, color Doppler and duplex ultrasound were
performed of bilateral carotid and vertebral arteries in the neck.

[Series 1: us carotid duplex bilat · 0.06mm/px · 13 of 51 slices shown]
[im 1/51]
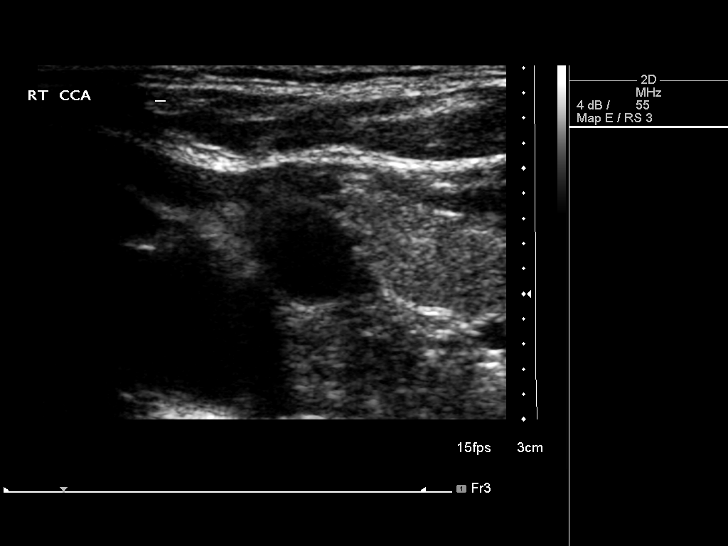
[im 5/51]
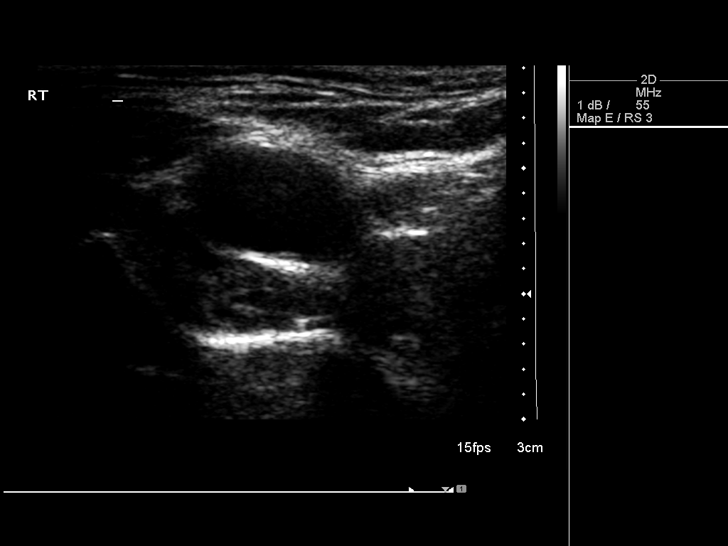
[im 9/51]
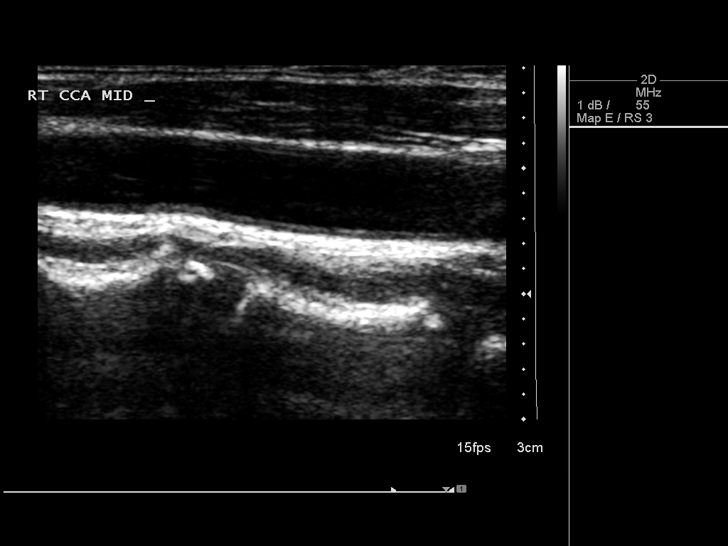
[im 14/51]
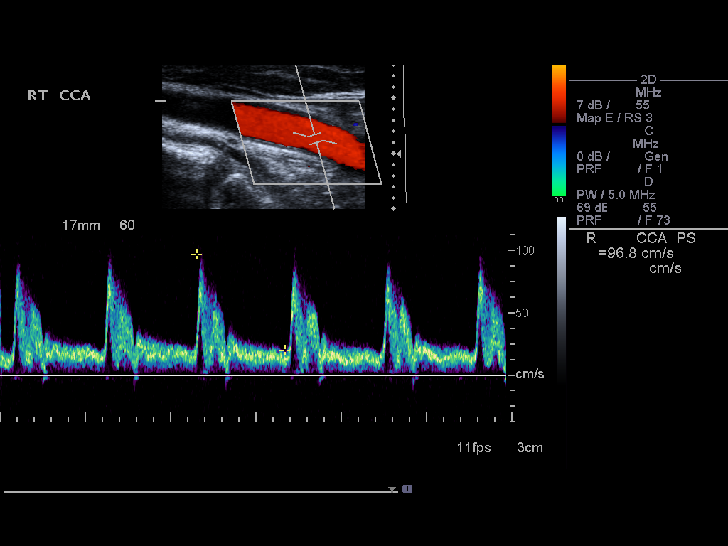
[im 18/51]
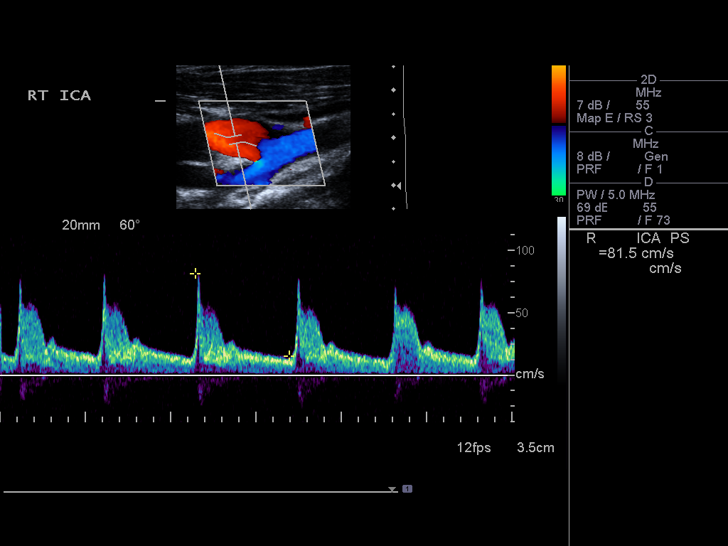
[im 22/51]
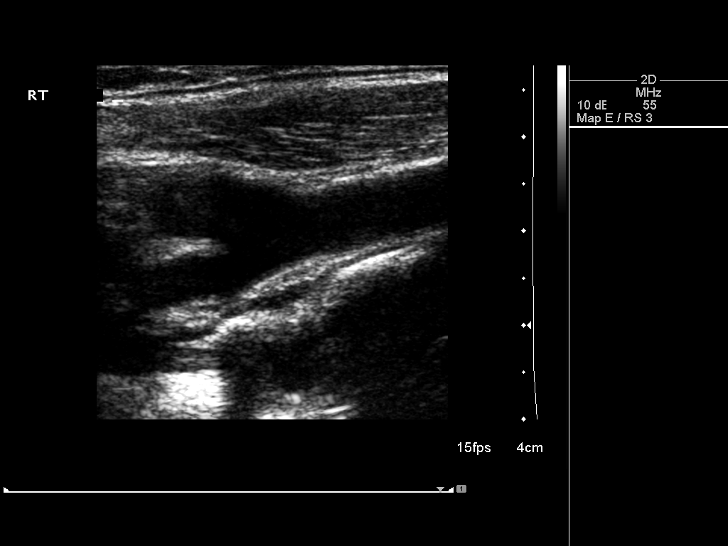
[im 27/51]
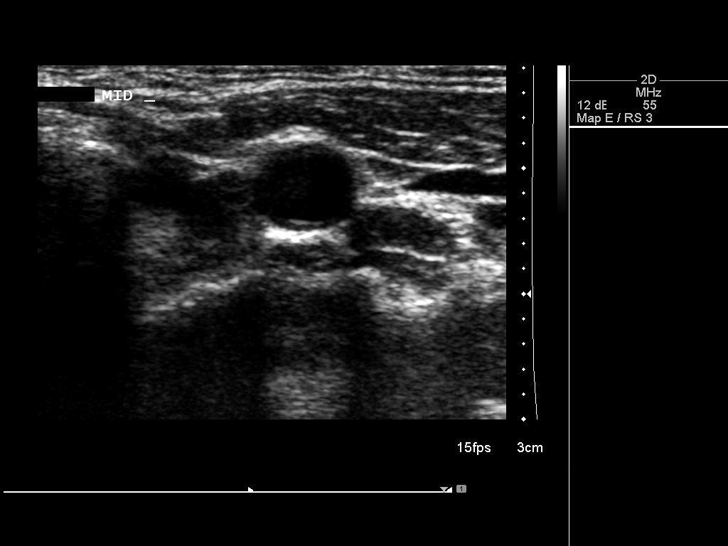
[im 29/51]
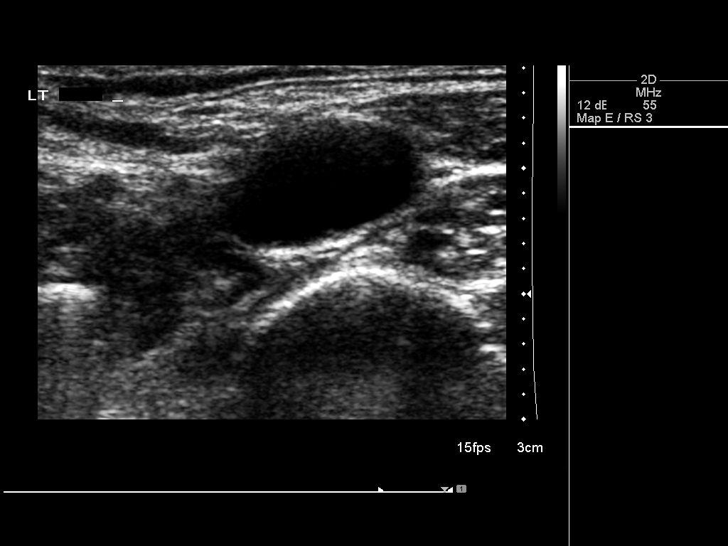
[im 33/51]
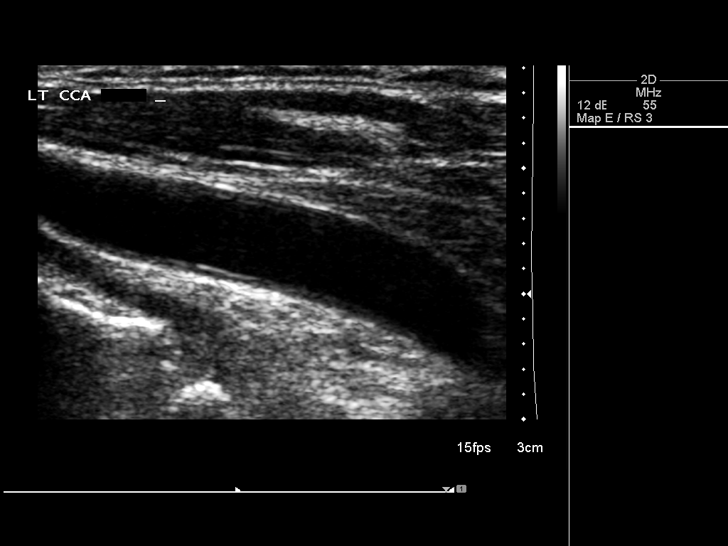
[im 37/51]
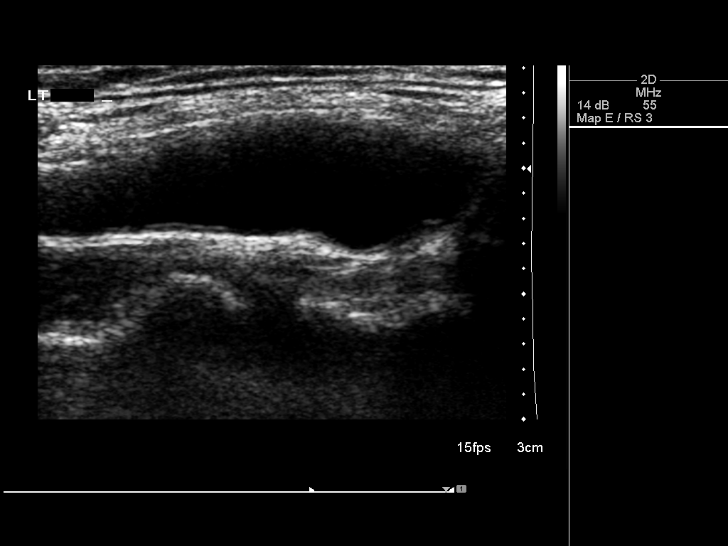
[im 42/51]
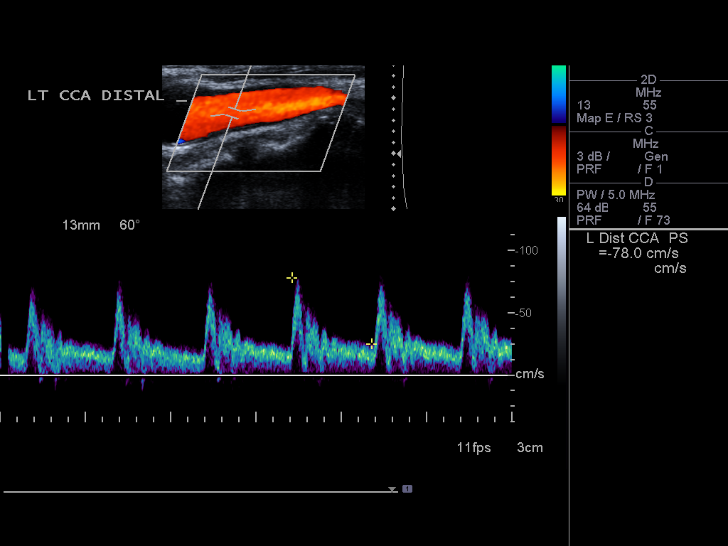
[im 46/51]
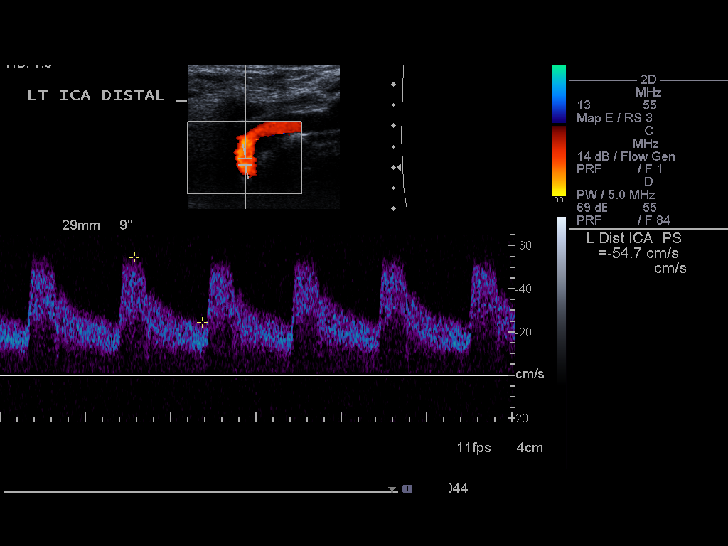
[im 51/51]
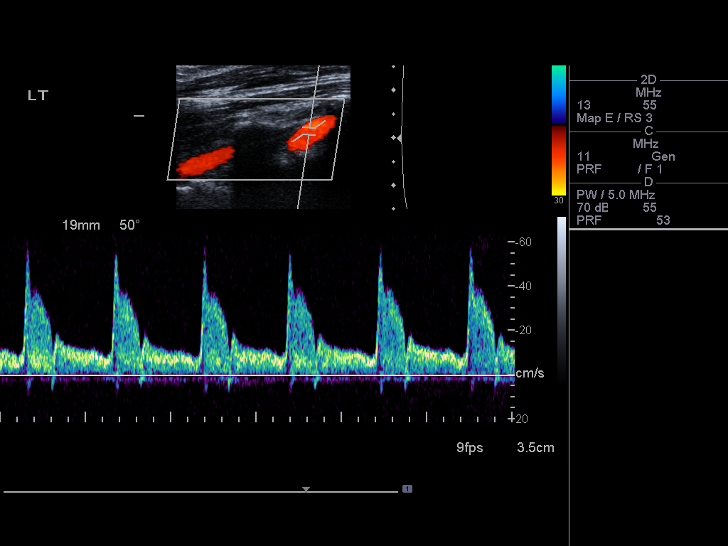

[13 of 24 positions shown; findings below may reference images not displayed]

FINDINGS: Criteria: Quantification of carotid stenosis is based on velocity
parameters that correlate the residual internal carotid diameter
with NASCET-based stenosis levels, using the diameter of the distal
internal carotid lumen as the denominator for stenosis measurement.

The following velocity measurements were obtained:

RIGHT

ICA:  82/15 cm/sec

CCA:  97/20 cm/sec

SYSTOLIC ICA/CCA RATIO:

DIASTOLIC ICA/CCA RATIO:

ECA:  82 cm/sec

LEFT

ICA:  88/41 cm/sec

CCA:  87/19 cm/sec

SYSTOLIC ICA/CCA RATIO:

DIASTOLIC ICA/CCA RATIO:

ECA:  65 cm/sec

RIGHT CAROTID ARTERY: No significant carotid atherosclerosis. Minor
intimal thickening. No hemodynamically significant right ICA
stenosis, velocity elevation, or turbulent flow. Degree of narrowing
less than 50%.

RIGHT VERTEBRAL ARTERY:  Antegrade

LEFT CAROTID ARTERY: No significant carotid atherosclerosis. Minor
intimal thickening. No hemodynamically significant left ICA
stenosis, velocity elevation, or turbulent flow.

LEFT VERTEBRAL ARTERY:  Antegrade
IMPRESSION: Minor carotid intimal thickening. No significant carotid
atherosclerosis or ICA stenosis by ultrasound. Degree of narrowing
less than 50% bilaterally.

Patent antegrade vertebral flow bilaterally

## 2018-07-09 DIAGNOSIS — H903 Sensorineural hearing loss, bilateral: Secondary | ICD-10-CM | POA: Diagnosis not present

## 2018-07-31 DIAGNOSIS — E782 Mixed hyperlipidemia: Secondary | ICD-10-CM | POA: Diagnosis not present

## 2018-07-31 DIAGNOSIS — N183 Chronic kidney disease, stage 3 (moderate): Secondary | ICD-10-CM | POA: Diagnosis not present

## 2018-07-31 DIAGNOSIS — L209 Atopic dermatitis, unspecified: Secondary | ICD-10-CM | POA: Diagnosis not present

## 2018-10-23 DIAGNOSIS — L209 Atopic dermatitis, unspecified: Secondary | ICD-10-CM | POA: Diagnosis not present

## 2018-10-23 DIAGNOSIS — E782 Mixed hyperlipidemia: Secondary | ICD-10-CM | POA: Diagnosis not present

## 2018-10-23 DIAGNOSIS — N183 Chronic kidney disease, stage 3 (moderate): Secondary | ICD-10-CM | POA: Diagnosis not present

## 2019-02-03 DIAGNOSIS — Z23 Encounter for immunization: Secondary | ICD-10-CM | POA: Diagnosis not present

## 2019-02-03 DIAGNOSIS — L209 Atopic dermatitis, unspecified: Secondary | ICD-10-CM | POA: Diagnosis not present

## 2019-02-03 DIAGNOSIS — E782 Mixed hyperlipidemia: Secondary | ICD-10-CM | POA: Diagnosis not present

## 2019-02-03 DIAGNOSIS — N183 Chronic kidney disease, stage 3 (moderate): Secondary | ICD-10-CM | POA: Diagnosis not present

## 2019-03-31 ENCOUNTER — Other Ambulatory Visit: Payer: Self-pay

## 2019-03-31 ENCOUNTER — Ambulatory Visit (INDEPENDENT_AMBULATORY_CARE_PROVIDER_SITE_OTHER): Payer: Medicare Other

## 2019-03-31 ENCOUNTER — Ambulatory Visit (HOSPITAL_COMMUNITY)
Admission: EM | Admit: 2019-03-31 | Discharge: 2019-03-31 | Disposition: A | Discharge status: Home / Self Care | Payer: Medicare Other | Attending: Urgent Care | Admitting: Urgent Care

## 2019-03-31 ENCOUNTER — Encounter (HOSPITAL_COMMUNITY): Payer: Self-pay

## 2019-03-31 DIAGNOSIS — S161XXA Strain of muscle, fascia and tendon at neck level, initial encounter: Secondary | ICD-10-CM

## 2019-03-31 DIAGNOSIS — R0789 Other chest pain: Secondary | ICD-10-CM | POA: Diagnosis not present

## 2019-03-31 DIAGNOSIS — R55 Syncope and collapse: Secondary | ICD-10-CM

## 2019-03-31 DIAGNOSIS — R0781 Pleurodynia: Secondary | ICD-10-CM

## 2019-03-31 DIAGNOSIS — M542 Cervicalgia: Secondary | ICD-10-CM | POA: Diagnosis not present

## 2019-03-31 DIAGNOSIS — R079 Chest pain, unspecified: Secondary | ICD-10-CM | POA: Diagnosis not present

## 2019-03-31 MED ORDER — CYCLOBENZAPRINE HCL 5 MG PO TABS: 5.0000 mg | ORAL_TABLET | Freq: Two times a day (BID) | ORAL | 0 refills | Status: DC | PRN
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
## 2019-03-31 NOTE — Discharge Instructions (Addendum)
You may take 500mg-650mg Tylenol every 6 hours for pain and inflammation. You can use this together with Flexeril as a muscle relaxant. Flexeril can make you sleepy so if it does this, then just take it at bedtime.  °
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
## 2019-04-19 DIAGNOSIS — H0102A Squamous blepharitis right eye, upper and lower eyelids: Secondary | ICD-10-CM | POA: Diagnosis not present

## 2019-04-19 DIAGNOSIS — H17823 Peripheral opacity of cornea, bilateral: Secondary | ICD-10-CM | POA: Diagnosis not present

## 2019-04-19 DIAGNOSIS — H16223 Keratoconjunctivitis sicca, not specified as Sjogren's, bilateral: Secondary | ICD-10-CM | POA: Diagnosis not present

## 2019-04-19 DIAGNOSIS — H2513 Age-related nuclear cataract, bilateral: Secondary | ICD-10-CM | POA: Diagnosis not present

## 2019-04-19 DIAGNOSIS — H40033 Anatomical narrow angle, bilateral: Secondary | ICD-10-CM | POA: Diagnosis not present

## 2019-04-26 DIAGNOSIS — Z0000 Encounter for general adult medical examination without abnormal findings: Secondary | ICD-10-CM | POA: Diagnosis not present

## 2019-08-11 DIAGNOSIS — H9190 Unspecified hearing loss, unspecified ear: Secondary | ICD-10-CM | POA: Diagnosis not present

## 2019-08-11 DIAGNOSIS — E782 Mixed hyperlipidemia: Secondary | ICD-10-CM | POA: Diagnosis not present

## 2019-08-11 DIAGNOSIS — E559 Vitamin D deficiency, unspecified: Secondary | ICD-10-CM | POA: Diagnosis not present

## 2019-08-11 DIAGNOSIS — N183 Chronic kidney disease, stage 3 unspecified: Secondary | ICD-10-CM | POA: Diagnosis not present

## 2020-01-26 DIAGNOSIS — E782 Mixed hyperlipidemia: Secondary | ICD-10-CM | POA: Diagnosis not present

## 2020-01-26 DIAGNOSIS — J309 Allergic rhinitis, unspecified: Secondary | ICD-10-CM | POA: Diagnosis not present

## 2020-01-26 DIAGNOSIS — E559 Vitamin D deficiency, unspecified: Secondary | ICD-10-CM | POA: Insufficient documentation

## 2020-01-26 DIAGNOSIS — R0982 Postnasal drip: Secondary | ICD-10-CM | POA: Diagnosis not present

## 2020-02-02 DIAGNOSIS — H903 Sensorineural hearing loss, bilateral: Secondary | ICD-10-CM | POA: Diagnosis not present

## 2020-02-02 DIAGNOSIS — H73899 Other specified disorders of tympanic membrane, unspecified ear: Secondary | ICD-10-CM | POA: Diagnosis not present

## 2020-02-07 DIAGNOSIS — H905 Unspecified sensorineural hearing loss: Secondary | ICD-10-CM | POA: Diagnosis not present

## 2020-04-19 DIAGNOSIS — H02831 Dermatochalasis of right upper eyelid: Secondary | ICD-10-CM | POA: Diagnosis not present

## 2020-04-19 DIAGNOSIS — H16223 Keratoconjunctivitis sicca, not specified as Sjogren's, bilateral: Secondary | ICD-10-CM | POA: Diagnosis not present

## 2020-04-19 DIAGNOSIS — H2513 Age-related nuclear cataract, bilateral: Secondary | ICD-10-CM | POA: Diagnosis not present

## 2020-04-19 DIAGNOSIS — H02834 Dermatochalasis of left upper eyelid: Secondary | ICD-10-CM | POA: Diagnosis not present

## 2020-04-19 DIAGNOSIS — H40033 Anatomical narrow angle, bilateral: Secondary | ICD-10-CM | POA: Diagnosis not present

## 2020-07-12 DIAGNOSIS — Z0000 Encounter for general adult medical examination without abnormal findings: Secondary | ICD-10-CM | POA: Diagnosis not present

## 2020-10-10 DIAGNOSIS — R351 Nocturia: Secondary | ICD-10-CM | POA: Insufficient documentation
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
## 2021-11-05 ENCOUNTER — Encounter (HOSPITAL_COMMUNITY): Payer: Self-pay | Admitting: Emergency Medicine

## 2021-11-05 ENCOUNTER — Ambulatory Visit (HOSPITAL_COMMUNITY)
Admission: EM | Admit: 2021-11-05 | Discharge: 2021-11-05 | Disposition: A | Discharge status: Home / Self Care | Payer: Medicare Other | Attending: Family Medicine | Admitting: Family Medicine

## 2021-11-05 ENCOUNTER — Other Ambulatory Visit: Payer: Self-pay

## 2021-11-05 DIAGNOSIS — R0789 Other chest pain: Secondary | ICD-10-CM

## 2021-11-05 MED ORDER — LIDOCAINE-EPINEPHRINE-TETRACAINE (LET) TOPICAL GEL
TOPICAL | Status: AC
  Filled 2021-11-05: qty 3

## 2021-11-05 MED ORDER — DICLOFENAC SODIUM 1 % EX GEL: 2.0000 g | Freq: Four times a day (QID) | CUTANEOUS | 0 refills | Status: DC

## 2021-11-05 MED ORDER — LIDOCAINE 5 % EX OINT: 1.0000 | TOPICAL_OINTMENT | Freq: Four times a day (QID) | CUTANEOUS | 0 refills | Status: DC | PRN

## 2021-11-05 MED ORDER — KETOROLAC TROMETHAMINE 30 MG/ML IJ SOLN
INTRAMUSCULAR | Status: AC
  Filled 2021-11-05: qty 1

## 2021-11-05 MED ORDER — LIDOCAINE-EPINEPHRINE-TETRACAINE (LET) TOPICAL GEL
3.0000 mL | Freq: Once | TOPICAL | Status: AC
  Administered 2021-11-05: 3 mL via TOPICAL

## 2021-11-05 MED ORDER — KETOROLAC TROMETHAMINE 30 MG/ML IJ SOLN
15.0000 mg | Freq: Once | INTRAMUSCULAR | Status: AC
  Administered 2021-11-05: 15 mg via INTRAMUSCULAR

## 2021-11-05 NOTE — ED Triage Notes (Signed)
Sharp chest pain for 3 days.  If patient straightens his back, he has pain in chest.  If bending forward and wrapping hands around himself, it hurts. No known injury.  Painful to palpation

## 2021-11-05 NOTE — ED Notes (Signed)
Patient called spouse

## 2021-11-05 NOTE — Discharge Instructions (Signed)
?? ??? ???? ?? ??? ??? ?? ???? ??? ??? ?? ??? ??? ? ?? ??? ?? ??? ??? ???? ? ????.  ???   EKG? ??? ???  ?? ?? ??? ?? ?? ?? ?? ??? ????.  ???? ??? ??? ?? ??  ??? ??? ?? ?? 4?(6????) ????? ?? ?? ? ????.  ??? ??? ?? ?? 4?(6????) ???? ?? ?? ? ????.  ??? ?? ???? ?? ????.  ??? ?? ???? 500mg ? ? 6???? ??? ? ????. ? ?? ??????? ??? ? ????.  10~15? ???? ??? ??? ????? ???? ? ? ????.  ???? ?? 1?? ??? ???? ?? ??? ?????.    Today your pain appears to be muscular as I am able to cause it by pressing on her chest, at a low suspicion that pain today is related to your heart or your lungs  Your EKG shows that your heart is beating at a door paced rhythm with  All of your vital signs appear to be within the normal ranges  You do not have symptoms related to the respiratory system  At home you may apply diclofenac gel 4 times daily (every 6 hours) as needed  At home you may apply lidocaine gel 4 times daily (every 6 hours) as needed  It is okay for you to use the medications together  You may give Tylenol 500 mg every 6 hours as needed, this medicine may be found over-the-counter  You may place ice or heat over the right side of the chest in 10 to 15-minute intervals  Please follow-up with your primary doctor in 1 week for reevaluation

## 2021-11-05 NOTE — ED Provider Notes (Signed)
[facility]    CSN: [id number] Arrival date & time: 11/05/21  1434      History   Chief Complaint Chief Complaint  Patient presents with   Chest Pain    HPI Shane Melton is a 81 y.o. male.   Presents with sided chest pain occurring constantly for 3 days.  Pain is rated a 10 out of 10 described as tightness, heaviness and sharp pains.  Pain is worsened with all movement and with lying down-year-old .  Pain can be felt with deep breathing.  Pain does not radiate.  Associated mild shortness of breath beginning in office.  This is the first occurrence of symptoms.  History of hyperlipidemia which wife endorses is a new diagnosis, not currently on medications.  Denies precipitating event, trauma or injury.  Past Medical History:  Diagnosis Date   Syncope, near 04/03/2015    Patient Active Problem List   Diagnosis Date Noted   Memory loss 01/06/2016   Dizziness and giddiness 01/06/2016   Syncope, near 04/03/2015   Eczema 06/29/2013    Past Surgical History:  Procedure Laterality Date   APPENDECTOMY     surgery on finger         Home Medications    Prior to Admission medications   Medication Sig Start Date End Date Taking? Authorizing Provider  cyclobenzaprine (FLEXERIL) 5 MG tablet Take 1 tablet (5 mg total) by mouth 2 (two) times daily as needed for muscle spasms. Patient not taking: Reported on 11/05/2021 03/31/19   Wallis Bamberg, PA-C  fluticasone [facility]) 50 MCG/ACT nasal spray Place 2 sprays into both nostrils daily. Patient not taking: Reported on 11/05/2021 07/11/16   McVey, Madelaine Bhat, PA-C    Family History Family History  Problem Relation Age of Onset   Throat cancer Mother        laryngeal cancer   Stomach cancer Brother 98   Dementia Sister 79    Social History Social History   Tobacco Use   Smoking status: Never   Smokeless tobacco: Never  Vaping Use   Vaping Use: Never used  Substance Use Topics   Alcohol use: No     Alcohol/week: 0.0 standard drinks of alcohol   Drug use: No     Allergies   Patient has no known allergies.   Review of Systems Review of Systems  Constitutional: Negative.   Respiratory: Negative.    Cardiovascular:  Positive for chest pain. Negative for palpitations and leg swelling.  Gastrointestinal: Negative.   Skin: Negative.   Neurological: Negative.      Physical Exam Triage Vital Signs ED Triage Vitals  Enc Vitals Group     BP 11/05/21 1501 (!) 145/71     Pulse Rate 11/05/21 1501 72     Resp 11/05/21 1501 20     Temp 11/05/21 1501 (!) 97.5 F (36.4 C)     Temp Source 11/05/21 1501 Oral     SpO2 11/05/21 1501 97 %     Weight --      Height --      Head Circumference --      Peak Flow --      Pain Score 11/05/21 1505 4     Pain Loc --      Pain Edu? --      Excl. in GC? --    No data found.  Updated Vital Signs BP (!) 145/71 (BP Location: Right Arm)   Pulse 72   Temp (!) 97.5  F (36.4 C) (Oral)   Resp 20   SpO2 97%   Visual Acuity Right Eye Distance:   Left Eye Distance:   Bilateral Distance:    Right Eye Near:   Left Eye Near:    Bilateral Near:     Physical Exam Constitutional:      Appearance: Normal appearance.  HENT:     Head: Normocephalic.  Eyes:     Extraocular Movements: Extraocular movements intact.  Cardiovascular:     Rate and Rhythm: Normal rate and regular rhythm.     Pulses: Normal pulses.     Heart sounds: Normal heart sounds.  Pulmonary:     Effort: Pulmonary effort is normal.     Breath sounds: Normal breath sounds.  Chest:     Comments: Tenderness present over the right chest wall beginning underneath the clavicle and extending to the ribs 3-4, no ecchymosis, swelling, crepitus or deformity,  chest wall is symmetrical Neurological:     Mental Status: He is alert and oriented to person, place, and time. Mental status is at baseline.  Psychiatric:        Mood and Affect: Mood normal.        Behavior: Behavior  normal.      UC Treatments / Results  Labs (all labs ordered are listed, but only abnormal results are displayed) Labs Reviewed - No data to display  EKG   Radiology No results found.  Procedures Procedures (including critical care time)  Medications Ordered in UC Medications - No data to display  Initial Impression / Assessment and Plan / UC Course  I have reviewed the triage vital signs and the nursing notes.  Pertinent labs & imaging results that were available during my care of the patient were reviewed by me and considered in my medical decision making (see chart for details).  Right-sided chest wall pain  Low suspicion for cardiac or respiratory involvement, pain is reproducible with palpation, vital signs are stable, blood pressure slightly elevated at 145/71 which is most likely related to pain, EKG showing normal sinus rhythm, discussed findings with patient and wife via interpreter, let gel applied to chest wall and Toradol injection given, on reevaluation symptoms have begun to subside and patient is feeling much better, able to rest comfortably on exam chair, prescribed diclofenac and lidocaine topical for outpatient use, may use Tylenol in addition, recommended rest, heat or ice and activity as tolerated, recommended follow-up with PCP in 1 week for reevaluation    Final Clinical Impressions(s) / UC Diagnoses   Final diagnoses:  None   Discharge Instructions   None    ED Prescriptions   None    PDMP not reviewed this encounter.   Valinda Hoar, NP 11/05/21 1652

## 2021-11-05 NOTE — ED Notes (Signed)
Patient in bathroom

## 2021-12-10 ENCOUNTER — Ambulatory Visit (INDEPENDENT_AMBULATORY_CARE_PROVIDER_SITE_OTHER): Payer: Medicare Other

## 2021-12-10 ENCOUNTER — Ambulatory Visit (HOSPITAL_COMMUNITY)
Admission: EM | Admit: 2021-12-10 | Discharge: 2021-12-10 | Disposition: A | Discharge status: Home / Self Care | Payer: Medicare Other | Attending: Physician Assistant | Admitting: Physician Assistant

## 2021-12-10 ENCOUNTER — Encounter (HOSPITAL_COMMUNITY): Payer: Self-pay

## 2021-12-10 DIAGNOSIS — M542 Cervicalgia: Secondary | ICD-10-CM | POA: Insufficient documentation

## 2021-12-10 DIAGNOSIS — R490 Dysphonia: Secondary | ICD-10-CM | POA: Insufficient documentation

## 2021-12-10 DIAGNOSIS — E041 Nontoxic single thyroid nodule: Secondary | ICD-10-CM | POA: Insufficient documentation

## 2021-12-10 LAB — BASIC METABOLIC PANEL
Anion gap: 7 (ref 5–15)
BUN: 20 mg/dL (ref 8–23)
CO2: 23 mmol/L (ref 22–32)
Calcium: 8.7 mg/dL — ABNORMAL LOW (ref 8.9–10.3)
Chloride: 106 mmol/L (ref 98–111)
Creatinine, Ser: 1.05 mg/dL (ref 0.61–1.24)
GFR, Estimated: >60 mL/min (ref >60)
Glucose, Bld: 116 mg/dL — ABNORMAL HIGH (ref 70–99)
Potassium: 3.7 mmol/L (ref 3.5–5.1)
Sodium: 136 mmol/L (ref 135–145)

## 2021-12-10 LAB — T4, FREE: Free T4: 0.92 ng/dL (ref 0.61–1.12)

## 2021-12-10 LAB — TSH: TSH: 2.257 u[IU]/mL (ref 0.350–4.500)

## 2021-12-10 MED ORDER — DICLOFENAC SODIUM 1 % EX GEL: 2.0000 g | Freq: Four times a day (QID) | CUTANEOUS | 0 refills | Status: DC

## 2021-12-10 NOTE — Discharge Instructions (Signed)
We will contact you with your lab work if it is abnormal.  Use diclofenac gel up to 4 times a day for pain relief.  He can use Tylenol for additional pain.  Follow-up with your PCP.  Call to schedule appointment with ENT to consider ultrasound of your thyroid.  If you have any worsening symptoms you need to be seen immediately.

## 2021-12-10 NOTE — ED Triage Notes (Signed)
Per interpreter, pt c/o stiffness to back of neck for 2 months and a hoarse voice when waking for a long time. Denies injury. Denies taking any meds for pain.
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
## 2021-12-24 ENCOUNTER — Encounter (HOSPITAL_COMMUNITY): Payer: Self-pay | Admitting: *Deleted

## 2021-12-24 ENCOUNTER — Ambulatory Visit (HOSPITAL_COMMUNITY)
Admission: EM | Admit: 2021-12-24 | Discharge: 2021-12-24 | Disposition: A | Discharge status: Home / Self Care | Payer: Medicare Other | Attending: Emergency Medicine | Admitting: Emergency Medicine

## 2021-12-24 ENCOUNTER — Other Ambulatory Visit: Payer: Self-pay

## 2021-12-24 DIAGNOSIS — M542 Cervicalgia: Secondary | ICD-10-CM

## 2021-12-24 DIAGNOSIS — R490 Dysphonia: Secondary | ICD-10-CM | POA: Diagnosis not present

## 2021-12-24 MED ORDER — DICLOFENAC SODIUM 1 % EX GEL: 2.0000 g | Freq: Four times a day (QID) | CUTANEOUS | 0 refills | Status: DC

## 2021-12-24 MED ORDER — ACETAMINOPHEN 500 MG PO TABS: 500.0000 mg | ORAL_TABLET | Freq: Four times a day (QID) | ORAL | 0 refills | Status: DC | PRN

## 2021-12-24 NOTE — Discharge Instructions (Addendum)
Tylenol every 6 hours for pain Voltaren gel 4 times daily  Please call the ear nose and throat specialist to schedule an appointment (on your previous discharge papers)
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
## 2021-12-24 NOTE — ED Provider Notes (Signed)
[facility]    CSN: [id number] Arrival date & time: 12/24/21  3151      History   Chief Complaint Chief Complaint  Patient presents with   Headache    HPI Shane Melton is a 81 y.o. male.  Interpreter used for this visit. Presents with wife who helps provide history.  He was seen 7/24 for neck pain and throat pain.  X-ray done at that time showed degenerative disc disease.  Provider had recommended he see ear nose and throat for possible thyroid ultrasound.  Patient reports he went to the ENT office but they did not understand that he was trying to make an appointment.  Presents today with continued symptoms.  Was given Voltaren gel at last visit, unsure if he has been using it 4 times a day as prescribed.  He has not tried any other medicine.  Reports neck pain occurs most when he looks all the way down.  Additionally hoarseness, especially in the morning.  Reports some external throat pain.  He has no problems swallowing food and liquids, reports some difficulty with swallowing his own saliva.  No associated trouble breathing. Denies any associated fevers or weight loss.  No vomiting or diarrhea.  Some low left quadrant abdominal pain that is relieved with bowel movement.  Thyroid labs checked at last visit returned within normal limits.  Past Medical History:  Diagnosis Date   Syncope, near 04/03/2015    Patient Active Problem List   Diagnosis Date Noted   Memory loss 01/06/2016   Dizziness and giddiness 01/06/2016   Syncope, near 04/03/2015   Eczema 06/29/2013    Past Surgical History:  Procedure Laterality Date   APPENDECTOMY     surgery on finger         Home Medications    Prior to Admission medications   Medication Sig Start Date End Date Taking? Authorizing Provider  acetaminophen (TYLENOL) 500 MG tablet Take 1 tablet (500 mg total) by mouth every 6 (six) hours as needed. 12/24/21  Yes Damarys Speir, Lurena Joiner, PA-C  diclofenac Sodium (VOLTAREN) 1 %  GEL Apply 2 g topically 4 (four) times daily. 12/24/21   Janki Dike, Lurena Joiner, PA-C  tamsulosin (FLOMAX) 0.4 MG CAPS capsule Take 0.4 mg by mouth daily. 11/07/21   [provider]    Family History Family History  Problem Relation Age of Onset   Throat cancer Mother        laryngeal cancer   Stomach cancer Brother 37   Dementia Sister 5    Social History Social History   Tobacco Use   Smoking status: Never   Smokeless tobacco: Never  Vaping Use   Vaping Use: Never used  Substance Use Topics   Alcohol use: No    Alcohol/week: 0.0 standard drinks of alcohol   Drug use: No     Allergies   Patient has no known allergies.   Review of Systems Review of Systems  Musculoskeletal:  Positive for neck pain.   Per HPI  Physical Exam Triage Vital Signs ED Triage Vitals  Enc Vitals Group     BP 12/24/21 0825 (!) 124/57     Pulse Rate 12/24/21 0825 87     Resp 12/24/21 0825 18     Temp 12/24/21 0825 98 F (36.7 C)     Temp src --      SpO2 12/24/21 0825 97 %     Weight --      Height --  Head Circumference --      Peak Flow --      Pain Score 12/24/21 0823 8     Pain Loc --      Pain Edu? --      Excl. in GC? --    No data found.  Updated Vital Signs BP (!) 124/57   Pulse 87   Temp 98 F (36.7 C)   Resp 18   SpO2 97%    Physical Exam Vitals and nursing note reviewed.  HENT:     Mouth/Throat:     Palate: No mass.     Pharynx: Oropharynx is clear. Uvula midline. No pharyngeal swelling or posterior oropharyngeal erythema.  Eyes:     Conjunctiva/sclera: Conjunctivae normal.  Neck:     Thyroid: Thyroid mass present. No thyromegaly or thyroid tenderness.     Trachea: Trachea and phonation normal.     Comments: small nontender, mobile area of the right thyroid gland Cardiovascular:     Rate and Rhythm: Normal rate and regular rhythm.     Pulses: Normal pulses.     Heart sounds: Normal heart sounds.  Pulmonary:     Effort: Pulmonary effort is  normal.     Breath sounds: Normal breath sounds.  Abdominal:     Tenderness: There is no abdominal tenderness.  Musculoskeletal:     Cervical back: Normal range of motion.  Lymphadenopathy:     Cervical: No cervical adenopathy.  Neurological:     General: No focal deficit present.     Mental Status: He is oriented to person, place, and time.     Cranial Nerves: No facial asymmetry.     Sensory: Sensation is intact.     Motor: Motor function is intact. No weakness or tremor.     Coordination: Coordination is intact.     Gait: Gait is intact.     Comments: Grip strength 5/5     UC Treatments / Results  Labs (all labs ordered are listed, but only abnormal results are displayed) Labs Reviewed - No data to display  EKG  Radiology No results found.  Procedures Procedures  Medications Ordered in UC Medications - No data to display  Initial Impression / Assessment and Plan / UC Course  I have reviewed the triage vital signs and the nursing notes.  Pertinent labs & imaging results that were available during my care of the patient were reviewed by me and considered in my medical decision making (see chart for details).  Neck pain likely due to the degenerative disk disease. No bony tenderness on exam, full ROM. We discussed how to use the Voltaren gel, 4 times daily.  Additionally I recommend he try Tylenol every 6 hours to see if this helps with his neck pain.  We did discuss that he needs to call the ENT office to set up an appointment.  Patient does have someone who can call and set the appointment up for him.  He understands to go to the emergency department with any worsening or severe symptoms.  Patient agrees to plan.  Final Clinical Impressions(s) / UC Diagnoses   Final diagnoses:  Hoarseness  Neck pain     Discharge Instructions      Tylenol every 6 hours for pain Voltaren gel 4 times daily  Please call the ear nose and throat specialist to schedule an  appointment (on your previous discharge papers)    ED Prescriptions     Medication Sig Dispense Auth. Provider  diclofenac Sodium (VOLTAREN) 1 % GEL Apply 2 g topically 4 (four) times daily. 50 g Donat Humble, PA-C   acetaminophen (TYLENOL) 500 MG tablet Take 1 tablet (500 mg total) by mouth every 6 (six) hours as needed. 30 tablet Bowie Delia, Lurena Joiner, PA-C      PDMP not reviewed this encounter.   Cherine Drumgoole, Lurena Joiner, [place] 12/24/21 2297

## 2022-02-17 ENCOUNTER — Emergency Department (HOSPITAL_BASED_OUTPATIENT_CLINIC_OR_DEPARTMENT_OTHER)
Admission: EM | Admit: 2022-02-17 | Discharge: 2022-02-17 | Disposition: A | Discharge status: Home / Self Care | Payer: Medicare Other | Attending: Student | Admitting: Student

## 2022-02-17 ENCOUNTER — Other Ambulatory Visit: Payer: Self-pay

## 2022-02-17 ENCOUNTER — Encounter (HOSPITAL_BASED_OUTPATIENT_CLINIC_OR_DEPARTMENT_OTHER): Payer: Self-pay | Admitting: Emergency Medicine

## 2022-02-17 DIAGNOSIS — X58XXXA Exposure to other specified factors, initial encounter: Secondary | ICD-10-CM | POA: Diagnosis not present

## 2022-02-17 DIAGNOSIS — S0502XA Injury of conjunctiva and corneal abrasion without foreign body, left eye, initial encounter: Secondary | ICD-10-CM

## 2022-02-17 MED ORDER — TETRACAINE HCL 0.5 % OP SOLN
1.0000 [drp] | Freq: Once | OPHTHALMIC | Status: AC
  Administered 2022-02-17: 1 [drp] via OPHTHALMIC
  Filled 2022-02-17: qty 4

## 2022-02-17 MED ORDER — ERYTHROMYCIN 5 MG/GM OP OINT
TOPICAL_OINTMENT | Freq: Once | OPHTHALMIC | Status: AC
  Administered 2022-02-17: 1 via OPHTHALMIC
  Filled 2022-02-17: qty 3.5

## 2022-02-17 MED ORDER — FLUORESCEIN SODIUM 1 MG OP STRP
1.0000 | ORAL_STRIP | Freq: Once | OPHTHALMIC | Status: AC
  Administered 2022-02-17: 1 via OPHTHALMIC
  Filled 2022-02-17: qty 1

## 2022-02-17 NOTE — Discharge Instructions (Addendum)
Use ointment 3 times daily.

## 2022-02-17 NOTE — ED Notes (Signed)
Patient was out mowing yesterday and feels like he has something in his eye. Patient left eye is red and swollen. Patient has not done anything at home to remedy the situation.

## 2022-02-17 NOTE — ED Provider Notes (Signed)
[place] [facility] DE[facility] Provider Note  CSN: [id number] Arrival date & time: 02/17/22 1923  Chief Complaint(s) Eye Problem  HPI Shane Melton is a 81 y.o. male who presents emergency department for evaluation of eye pain and foreign body sensation.  Patient states that he was mowing the lawn this morning and felt like something got into the top of his eye.  He denies visual changes or blurry vision but endorses foreign body sensation and erythema to the left eye.  Denies any complaints in the right eye.  Denies chest pain, shortness of breath, abdominal pain, nausea vomiting or other systemic symptoms.   Past Medical History Past Medical History:  Diagnosis Date   Syncope, near 04/03/2015   Patient Active Problem List   Diagnosis Date Noted   Memory loss 01/06/2016   Dizziness and giddiness 01/06/2016   Syncope, near 04/03/2015   Eczema 06/29/2013   Home Medication(s) Prior to Admission medications   Medication Sig Start Date End Date Taking? Authorizing Provider  acetaminophen (TYLENOL) 500 MG tablet Take 1 tablet (500 mg total) by mouth every 6 (six) hours as needed. 12/24/21   Rising, Wells Guiles, PA-C  diclofenac Sodium (VOLTAREN) 1 % GEL Apply 2 g topically 4 (four) times daily. 12/24/21   Rising, Wells Guiles, PA-C  tamsulosin (FLOMAX) 0.4 MG CAPS capsule Take 0.4 mg by mouth daily. 11/07/21   [provider]                                                                                                                                    Past Surgical History Past Surgical History:  Procedure Laterality Date   APPENDECTOMY     surgery on finger     Family History Family History  Problem Relation Age of Onset   Throat cancer Mother        laryngeal cancer   Stomach cancer Brother 87   Dementia Sister 6    Social History Social History   Tobacco Use   Smoking status: Never   Smokeless tobacco: Never  Vaping Use   Vaping Use: Never used   Substance Use Topics   Alcohol use: No    Alcohol/week: 0.0 standard drinks of alcohol   Drug use: No   Allergies Patient has no known allergies.  Review of Systems Review of Systems  Eyes:  Positive for pain, discharge and redness.    Physical Exam Vital Signs  I have reviewed the triage vital signs BP 123/76 (BP Location: Left Arm)   Pulse 66   Temp 97.7 F (36.5 C) (Oral)   Resp 14   Ht 5\' 5"  (1.651 m)   Wt 72.1 kg   SpO2 96%   BMI 26.46 kg/m   Physical Exam Constitutional:      General: He is not in acute distress.    Appearance: Normal appearance.  HENT:     Head: Normocephalic and atraumatic.  Nose: No congestion or rhinorrhea.  Eyes:     General:        Right eye: No discharge.        Left eye: Discharge present.    Extraocular Movements: Extraocular movements intact.     Pupils: Pupils are equal, round, and reactive to light.  Cardiovascular:     Rate and Rhythm: Normal rate and regular rhythm.     Heart sounds: No murmur heard. Pulmonary:     Effort: No respiratory distress.     Breath sounds: No wheezing or rales.  Abdominal:     General: There is no distension.     Tenderness: There is no abdominal tenderness.  Musculoskeletal:        General: Normal range of motion.     Cervical back: Normal range of motion.  Skin:    General: Skin is warm and dry.  Neurological:     General: No focal deficit present.     Mental Status: He is alert.     ED Results and Treatments Labs (all labs ordered are listed, but only abnormal results are displayed) Labs Reviewed - No data to display                                                                                                                        Radiology No results found.  Pertinent labs & imaging results that were available during my care of the patient were reviewed by me and considered in my medical decision making (see MDM for details).  Medications Ordered in ED Medications   tetracaine (PONTOCAINE) 0.5 % ophthalmic solution 1 drop (has no administration in time range)  fluorescein ophthalmic strip 1 strip (has no administration in time range)  erythromycin ophthalmic ointment (has no administration in time range)                                                                                                                                     Procedures Procedures  (including critical care time)  Medical Decision Making / ED Course   This patient presents to the ED for concern of eye pain, foreign body sensation, this involves an extensive number of treatment options, and is a complaint that carries with it a high risk of complications and morbidity.  The differential diagnosis includes corneal abrasion, corneal ulcer, persistent ocular foreign body, uveitis  MDM: Patient seen  emergency room for evaluation of eye pain and foreign body sensation.  Physical exam including fluorescein exam showing corneal abrasion in the left.  Upper eyelids retracted with no retained foreign body.  Erythromycin ointment given to the patient at bedside and he was instructed to use this 3 times a day and follow-up outpatient with an ophthalmologist.  Patient then discharged.   Additional history obtained: -Additional history obtained from family member -External records from outside source obtained and reviewed including: Chart review including previous notes, labs, imaging, consultation notes     Medicines ordered and prescription drug management: Meds ordered this encounter  Medications   tetracaine (PONTOCAINE) 0.5 % ophthalmic solution 1 drop   fluorescein ophthalmic strip 1 strip   erythromycin ophthalmic ointment    -I have reviewed the patients home medicines and have made adjustments as needed  Critical interventions none    Social Determinants of Health:  Factors impacting patients care include: [place] speaking   Reevaluation: After the interventions  noted above, I reevaluated the patient and found that they have :improved  Co morbidities that complicate the patient evaluation  Past Medical History:  Diagnosis Date   Syncope, near 04/03/2015      Dispostion: I considered admission for this patient, but he does not meet inpatient criteria for admission is safe for discharge with outpatient ophthalmology follow-up with antibiotics     Final Clinical Impression(s) / ED Diagnoses Final diagnoses:  None     @PCDICTATION @    Tallie Hevia, , MD 02/18/22 1301

## 2022-02-17 NOTE — ED Triage Notes (Signed)
Pt got something in LT eye when he was mowing yesterday; redness noted, pt c/o pain

## 2022-03-25 ENCOUNTER — Emergency Department (HOSPITAL_BASED_OUTPATIENT_CLINIC_OR_DEPARTMENT_OTHER): Payer: Medicare Other

## 2022-03-25 ENCOUNTER — Emergency Department (HOSPITAL_BASED_OUTPATIENT_CLINIC_OR_DEPARTMENT_OTHER)
Admission: EM | Admit: 2022-03-25 | Discharge: 2022-03-26 | Discharge status: Against Medical Advice | Payer: Medicare Other | Attending: Emergency Medicine | Admitting: Emergency Medicine

## 2022-03-25 ENCOUNTER — Other Ambulatory Visit: Payer: Self-pay

## 2022-03-25 DIAGNOSIS — M542 Cervicalgia: Secondary | ICD-10-CM | POA: Insufficient documentation

## 2022-03-25 DIAGNOSIS — M546 Pain in thoracic spine: Secondary | ICD-10-CM | POA: Insufficient documentation

## 2022-03-25 DIAGNOSIS — Z5329 Procedure and treatment not carried out because of patient's decision for other reasons: Secondary | ICD-10-CM | POA: Insufficient documentation

## 2022-03-25 DIAGNOSIS — R519 Headache, unspecified: Secondary | ICD-10-CM | POA: Insufficient documentation

## 2022-03-25 DIAGNOSIS — H538 Other visual disturbances: Secondary | ICD-10-CM | POA: Insufficient documentation

## 2022-03-25 DIAGNOSIS — H539 Unspecified visual disturbance: Secondary | ICD-10-CM

## 2022-03-25 DIAGNOSIS — H532 Diplopia: Secondary | ICD-10-CM | POA: Insufficient documentation

## 2022-03-25 LAB — SEDIMENTATION RATE: Sed Rate: 3 mm/hr (ref 0–16)

## 2022-03-25 LAB — BASIC METABOLIC PANEL
Anion gap: 7 (ref 5–15)
BUN: 16 mg/dL (ref 8–23)
CO2: 26 mmol/L (ref 22–32)
Calcium: 8.8 mg/dL — ABNORMAL LOW (ref 8.9–10.3)
Chloride: 104 mmol/L (ref 98–111)
Creatinine, Ser: 0.93 mg/dL (ref 0.61–1.24)
GFR, Estimated: >60 mL/min (ref >60)
Glucose, Bld: 101 mg/dL — ABNORMAL HIGH (ref 70–99)
Potassium: 4.2 mmol/L (ref 3.5–5.1)
Sodium: 137 mmol/L (ref 135–145)

## 2022-03-25 LAB — CBC
HCT: 41 % (ref 39.0–52.0)
Hemoglobin: 14.2 g/dL (ref 13.0–17.0)
MCH: 35 pg — ABNORMAL HIGH (ref 26.0–34.0)
MCHC: 34.6 g/dL (ref 30.0–36.0)
MCV: 101 fL — ABNORMAL HIGH (ref 80.0–100.0)
Platelets: 183 10*3/uL (ref 150–400)
RBC: 4.06 MIL/uL — ABNORMAL LOW (ref 4.22–5.81)
RDW: 12.7 % (ref 11.5–15.5)
WBC: 4.5 10*3/uL (ref 4.0–10.5)
nRBC: 0 % (ref 0.0–0.2)

## 2022-03-25 LAB — URINALYSIS, ROUTINE W REFLEX MICROSCOPIC
Bilirubin Urine: NEGATIVE
Glucose, UA: NEGATIVE mg/dL
Hgb urine dipstick: NEGATIVE
Ketones, ur: NEGATIVE mg/dL
Leukocytes,Ua: NEGATIVE
Nitrite: NEGATIVE
Protein, ur: NEGATIVE mg/dL
Specific Gravity, Urine: 1.015 (ref 1.005–1.030)
pH: 7 (ref 5.0–8.0)

## 2022-03-25 MED ORDER — IOHEXOL 350 MG/ML SOLN
75.0000 mL | Freq: Once | INTRAVENOUS | Status: AC | PRN
  Administered 2022-03-25: 75 mL via INTRAVENOUS

## 2022-03-25 NOTE — ED Notes (Addendum)
Patient is here from [facility] ED for MRI.  NAD, the patient has had an intermittent headache since Friday.  Patient stated while at [facility] that when he stands his vision goes black, sometimes.

## 2022-03-25 NOTE — ED Notes (Signed)
Patient to CT.

## 2022-03-25 NOTE — ED Provider Notes (Signed)
[place] EMERGENCY DEPARTMENT Provider Note   CSN: [id number] Arrival date & time: 03/25/22  1130     History  Chief Complaint  Patient presents with   Headache    Shane Melton is a 81 y.o. male presenting today with complaint of worsening headaches and temporary vision loss.  Patient states the headaches have been worsening since March.  States these are not accompanied by any other symptoms such as nausea, vomiting, dizziness, lightheadedness, blurry vision, double vision, neck stiffness, abdominal pain, leg weakness, or sensitivity to light or sound.  States these are usually worse with stress.  Usually improved with rest, closing his eyes in a dark room, and massaging his head.  Occasionally uses over-the-counter medications such as Tylenol or ibuprofen.  Headaches reportedly occurred almost daily.  Pain has been worsening, located on the back right side of the neck or the left upper back of the head.  Headaches are not sudden in onset, and not described as the worst headache of his life.  Also complaining of increasing memory loss, or "slow word searching".  Occurs randomly, short lasting, and over "small things" such as items or names.  Occasionally has difficulty finding words to contribute to conversation.  Denies this within the last few days.  Denies difficulty swallowing.  Also reports a recent episode of "concern".  States patient experienced "seeing black" when he turned over in bed in the middle of the night.  States he felt like he was falling twice while laying there.  Then went up to use the bathroom, sat down on the commode, had a few seconds of blurry vision, then experienced another episode of "seeing black", and fell to his left onto his arm.  Vision restored within 10 seconds.  Denies any pain following the fall.  Has not had vision loss since this episode.  Denies hitting head or LOC.  Denies dizziness, lightheadedness, or double vision before or after the  fall.  Denies tenderness of the temples.  Not on anticoagulation.  No known prior history of stroke.   The history is provided by the patient and medical records. The history is limited by a language barrier. A language interpreter was used (Patient's family member).  Headache     Home Medications Prior to Admission medications   Medication Sig Start Date End Date Taking? Authorizing Provider  acetaminophen (TYLENOL) 500 MG tablet Take 1 tablet (500 mg total) by mouth every 6 (six) hours as needed. 12/24/21   Rising, Wells Guiles, PA-C  diclofenac Sodium (VOLTAREN) 1 % GEL Apply 2 g topically 4 (four) times daily. 12/24/21   Rising, Wells Guiles, PA-C  tamsulosin (FLOMAX) 0.4 MG CAPS capsule Take 0.4 mg by mouth daily. 11/07/21   [provider]      Allergies    Patient has no known allergies.    Review of Systems   Review of Systems  Eyes:        Temporary vision loss  Neurological:  Positive for headaches.    Physical Exam Updated Vital Signs BP (!) 155/86   Pulse 71   Temp 98 F (36.7 C) (Oral)   Resp 18   SpO2 96%  Physical Exam Vitals and nursing note reviewed.  Constitutional:      General: He is not in acute distress.    Appearance: He is well-developed.  HENT:     Head: Normocephalic and atraumatic.     Mouth/Throat:     Mouth: Mucous membranes are moist.  Pharynx: Oropharynx is clear.  Eyes:     General: Lids are normal. Gaze aligned appropriately. No visual field deficit or scleral icterus.       Right eye: No discharge or hordeolum.        Left eye: No discharge or hordeolum.     Extraocular Movements: Extraocular movements intact.     Conjunctiva/sclera: Conjunctivae normal.     Right eye: Right conjunctiva is not injected. No chemosis, exudate or hemorrhage.    Left eye: Left conjunctiva is not injected. No chemosis, exudate or hemorrhage.    Pupils: Pupils are equal, round, and reactive to light.     Visual Fields: Right eye visual fields normal and  left eye visual fields normal.  Neck:     Comments: Very supple, no meningismus or torticollis Cardiovascular:     Rate and Rhythm: Normal rate and regular rhythm.     Heart sounds: Normal heart sounds. No murmur heard. Pulmonary:     Effort: Pulmonary effort is normal. No respiratory distress.     Breath sounds: Normal breath sounds.  Chest:     Chest wall: No tenderness.  Abdominal:     General: There is no distension.     Palpations: Abdomen is soft.     Tenderness: There is no abdominal tenderness.  Musculoskeletal:        General: No swelling.     Cervical back: Neck supple.  Skin:    General: Skin is warm and dry.     Capillary Refill: Capillary refill takes less than 2 seconds.  Neurological:     Mental Status: He is alert and oriented to person, place, and time.     GCS: GCS eye subscore is 4. GCS verbal subscore is 5. GCS motor subscore is 6.     Cranial Nerves: No dysarthria or facial asymmetry.     Sensory: No sensory deficit.     Motor: No weakness, tremor or pronator drift.     Coordination: Coordination normal. Finger-Nose-Finger Test and Heel to [facility] Test normal.     Gait: Gait normal.     Comments: CN V, CN VII-CN XII appear grossly intact.  No facial asymmetry, truncal deviation, abnormal EOMs, abnormal gait, discoordination, dysarthria, or sensory deficits appreciated.  Psychiatric:        Mood and Affect: Mood normal.     ED Results / Procedures / Treatments   Labs (all labs ordered are listed, but only abnormal results are displayed) Labs Reviewed  BASIC METABOLIC PANEL - Abnormal; Notable for the following components:      Result Value   Glucose, Bld 101 (*)    Calcium 8.8 (*)    All other components within normal limits  CBC - Abnormal; Notable for the following components:   RBC 4.06 (*)    MCV 101.0 (*)    MCH 35.0 (*)    All other components within normal limits  URINALYSIS, ROUTINE W REFLEX MICROSCOPIC  C-REACTIVE PROTEIN  SEDIMENTATION  RATE    EKG EKG Interpretation  Date/Time:  Monday March 25 2022 11:56:15 EST Ventricular Rate:  65 PR Interval:  206 QRS Duration: 82 QT Interval:  394 QTC Calculation: 409 R Axis:   37 Text Interpretation: Normal sinus rhythm Normal ECG When compared with ECG of 05-Nov-2010 14:29, PREVIOUS ECG IS PRESENT when compared to priorm similar appearance. No sTEMI Confirmed by Antony Blackbird [phone]) on 03/25/2022 5:05:57 PM  Radiology CT VENOGRAM HEAD  Result Date: 03/25/2022 CLINICAL DATA:  Stroke suspected, dural venous sinus thrombosis suspected EXAM: CT ANGIOGRAPHY HEAD AND NECK CT VENOGRAM TECHNIQUE: Multidetector CT imaging of the head and neck was performed using the standard protocol during bolus administration of intravenous contrast. Multiplanar CT image reconstructions and MIPs were obtained to evaluate the vascular anatomy. Carotid stenosis measurements (when applicable) are obtained utilizing NASCET criteria, using the distal internal carotid diameter as the denominator. Venographic phase images of the brain were obtained following the administration of intravenous contrast. Multiplanar reformats and maximum intensity projections were generated. RADIATION DOSE REDUCTION: This exam was performed according to the departmental dose-optimization program which includes automated exposure control, adjustment of the mA and/or kV according to patient size and/or use of iterative reconstruction technique. CONTRAST:  50m OMNIPAQUE IOHEXOL 350 MG/ML SOLN COMPARISON:  No prior CTA or CTV, correlation is made with 04/04/2015 MRI head FINDINGS: CT HEAD FINDINGS Brain: No evidence of acute infarct, hemorrhage, mass, mass effect, or midline shift. No hydrocephalus or extra-axial fluid collection. Vascular: No hyperdense vessel. Skull: Normal. Negative for fracture or focal lesion. Sinuses/Orbits: Mucosal thickening in the left maxillary sinus and ethmoid air cells. The orbits are unremarkable. Other: The  mastoid air cells are well aerated. CTA NECK FINDINGS Aortic arch: Standard branching. Imaged portion shows no evidence of aneurysm or dissection. No significant stenosis of the major arch vessel origins. Right carotid system: No evidence of dissection, occlusion, or hemodynamically significant stenosis (greater than 50%). Left carotid system: No evidence of dissection, occlusion, or hemodynamically significant stenosis (greater than 50%). Vertebral arteries: Evaluation is somewhat limited by dense intravenous contrast in the adjacent veins. Within this limitation, no evidence of dissection, occlusion, or hemodynamically significant stenosis (greater than 50%). Skeleton: No acute osseous abnormality. Other neck: Negative. Upper chest: No focal pulmonary opacity or pleural effusion. Review of the MIP images confirms the above findings CTA HEAD FINDINGS Anterior circulation: Both internal carotid arteries are patent to the termini, with moderate stenosis in the right cavernous ICA and mild stenosis in the proximal left supraclinoid ICA. A1 segments patent. Normal anterior communicating artery. Anterior cerebral arteries are patent to their distal aspects. No M1 stenosis or occlusion. MCA branches perfused and symmetric. Posterior circulation: Vertebral arteries patent to the vertebrobasilar junction without stenosis. Posterior inferior cerebellar arteries patent proximally. Basilar patent to its distal aspect. Superior cerebellar arteries patent proximally. Patent P1 segments. PCAs perfused to their distal aspects without stenosis. The bilateral posterior communicating arteries are not visualized. Anatomic variants: None significant. Review of the MIP images confirms the above findings CTV HEAD FINDINGS Superior sagittal sinus: Normal. Straight sinus: Normal. Inferior sagittal sinus, vein of Galen and internal cerebral veins: Normal. Transverse sinuses: Normal. Sigmoid sinuses: Normal. Visualized jugular veins: Normal  IMPRESSION: 1. No acute intracranial process. 2. No intracranial large vessel occlusion. 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 3. No evidence of dural venous sinus thrombosis. 4. No hemodynamically significant stenosis in the neck. Electronically Signed   By: AMerilyn BabaM.D.   On: 03/25/2022 20:42   CT ANGIO HEAD NECK W WO CM  Result Date: 03/25/2022 CLINICAL DATA:  Stroke suspected, dural venous sinus thrombosis suspected EXAM: CT ANGIOGRAPHY HEAD AND NECK CT VENOGRAM TECHNIQUE: Multidetector CT imaging of the head and neck was performed using the standard protocol during bolus administration of intravenous contrast. Multiplanar CT image reconstructions and MIPs were obtained to evaluate the vascular anatomy. Carotid stenosis measurements (when applicable) are obtained utilizing NASCET criteria, using the  distal internal carotid diameter as the denominator. Venographic phase images of the brain were obtained following the administration of intravenous contrast. Multiplanar reformats and maximum intensity projections were generated. RADIATION DOSE REDUCTION: This exam was performed according to the departmental dose-optimization program which includes automated exposure control, adjustment of the mA and/or kV according to patient size and/or use of iterative reconstruction technique. CONTRAST:  82m OMNIPAQUE IOHEXOL 350 MG/ML SOLN COMPARISON:  No prior CTA or CTV, correlation is made with 04/04/2015 MRI head FINDINGS: CT HEAD FINDINGS Brain: No evidence of acute infarct, hemorrhage, mass, mass effect, or midline shift. No hydrocephalus or extra-axial fluid collection. Vascular: No hyperdense vessel. Skull: Normal. Negative for fracture or focal lesion. Sinuses/Orbits: Mucosal thickening in the left maxillary sinus and ethmoid air cells. The orbits are unremarkable. Other: The mastoid air cells are well aerated. CTA NECK FINDINGS Aortic arch:  Standard branching. Imaged portion shows no evidence of aneurysm or dissection. No significant stenosis of the major arch vessel origins. Right carotid system: No evidence of dissection, occlusion, or hemodynamically significant stenosis (greater than 50%). Left carotid system: No evidence of dissection, occlusion, or hemodynamically significant stenosis (greater than 50%). Vertebral arteries: Evaluation is somewhat limited by dense intravenous contrast in the adjacent veins. Within this limitation, no evidence of dissection, occlusion, or hemodynamically significant stenosis (greater than 50%). Skeleton: No acute osseous abnormality. Other neck: Negative. Upper chest: No focal pulmonary opacity or pleural effusion. Review of the MIP images confirms the above findings CTA HEAD FINDINGS Anterior circulation: Both internal carotid arteries are patent to the termini, with moderate stenosis in the right cavernous ICA and mild stenosis in the proximal left supraclinoid ICA. A1 segments patent. Normal anterior communicating artery. Anterior cerebral arteries are patent to their distal aspects. No M1 stenosis or occlusion. MCA branches perfused and symmetric. Posterior circulation: Vertebral arteries patent to the vertebrobasilar junction without stenosis. Posterior inferior cerebellar arteries patent proximally. Basilar patent to its distal aspect. Superior cerebellar arteries patent proximally. Patent P1 segments. PCAs perfused to their distal aspects without stenosis. The bilateral posterior communicating arteries are not visualized. Anatomic variants: None significant. Review of the MIP images confirms the above findings CTV HEAD FINDINGS Superior sagittal sinus: Normal. Straight sinus: Normal. Inferior sagittal sinus, vein of Galen and internal cerebral veins: Normal. Transverse sinuses: Normal. Sigmoid sinuses: Normal. Visualized jugular veins: Normal IMPRESSION: 1. No acute intracranial process. 2. No intracranial  large vessel occlusion. 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 3. No evidence of dural venous sinus thrombosis. 4. No hemodynamically significant stenosis in the neck. Electronically Signed   By: AMerilyn BabaM.D.   On: 03/25/2022 20:42    Procedures Procedures    Medications Ordered in ED Medications  iohexol (OMNIPAQUE) 350 MG/ML injection 75 mL (75 mLs Intravenous Contrast Given 03/25/22 1949)    ED Course/ Medical Decision Making/ A&P Clinical Course as of 03/25/22 2107  [facility]  19[address] Consulted with Dr. SQuinn Axeof neurology.  Discussed patient's case and history in detail.  Concern for possible cavernous venous thrombosis or posterior circulation issue.  Agrees with plan for CTA head and neck and CTV head.  Also recommends ESR and CRP in addition to MRI of brain and orbits with and without.  [facility].  If ESR and/or CRP are elevated, or if imaging is abnormal, reconsult with neurology for likely admission and further work-up.  If ESR/CRP and imaging are unremarkable, may likely be able to follow-up outpatient with neurology within the next few days. [AC]  2044 CT imaging completed and ESR/CRP in process.  Patient now needs MRI.  Plan for ED to ED transfer to [facility].  Discussed patient case with Dr. Margaretmary Eddy over the phone, plans to accept the patient for remainder of encounter. [AC]  2049 CT ANGIO HEAD NECK W WO CM No intracranial LVO. 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 Radiology: ordered. Decision-making details documented in ED  Course.  Risk Prescription drug management.   81 y.o. male presents to the ED for concern of Headache     This involves an extensive number of treatment options, and is a complaint that carries with it a high risk of complications and morbidity.  The emergent differential diagnosis prior to evaluation includes, but is not limited to: Cavernous venous thrombosis, posterior circulation TIA/CVA, carotid aneurysm  This is not an exhaustive differential.   Past Medical History / Co-morbidities / Social History: Hx of prior appendectomy. Social Determinants of Health include: Elderly, language barrier, family member present provided translation services  Additional History:  None  Lab Tests: I ordered, and personally interpreted labs.  The pertinent results include:   ESR and CRP pending BMP and CBC overall unremarkable, no electrolyte derangement or significant anemia UA unremarkable  Imaging Studies: I ordered imaging studies including CTA head and neck, CTV head.   I independently visualized and interpreted imaging which showed  CTA: No intracranial LVO, with areas of noted stenosis see above CTV: No evidence of cavernous venous thrombosis I agree with the radiologist interpretation.  ED Course / Critical Interventions: Pt well-appearing on exam.  Nontoxic, nonseptic appearing in NAD.  Afebrile.  Presenting to the ED today with worsening headache over the last 7 to 8 months.  Headaches occur daily now.  Significant changes of the last 1 month.  Now with an episode of possible syncope with blurred vision and bilateral vision loss last week on Friday.  No known significant cardiac history.  Neuro exam today overall unremarkable without evidence of acute deficits, see above for details.  No temporal tenderness, without painful vision loss, lower initial suspicion for giant cell arteritis.  No meningismus or torticollis, afebrile, neck very supple on exam, low initial suspicion for  meningitis.  No recent URI symptoms.  Not on anticoagulation.  Concern for possible cavernous venous thrombosis vs TIA/CVA, possible posterior circulation issue.  Gait still intact at this time.  No vision abnormalities observed or reported by patient at this time.  Plan to consult with neurology for further recommendations. Consulted with neurology, see above.  Plan to proceed with CTA head and neck, CTV head, ESR and CRP.   Upon reevaluation, patient status is unchanged.  CT imaging without significant acute findings.  ESR and CRP pending.  Plan for ED to ED transfer at this time.  Accepting physician Dr. Philip Aspen of [facility].  Disposition: ED to ED transfer for continuation of work-up.  Plan for MRI of brain and orbits upon arrival.  Again CRP and ESR in process.  Depending on results, plan to reconsult with neurology for further recommendations.  If any abnormal findings, likely admission for further work-up.  If all findings negative, may consider close outpatient follow-up with neurology.  This chart was dictated using voice recognition software.  Despite best efforts to proofread, errors can occur which can change the documentation meaning.         Final Clinical Impression(s) / ED Diagnoses Final diagnoses:  None    Rx / DC Orders ED Discharge Orders     None         Candace Cruise [birth year] 2110    Tegeler, Gwenyth Allegra, MD 03/26/22 0003

## 2022-03-25 NOTE — ED Triage Notes (Signed)
Patient presents to ED via POV from home. Here with headache since Friday. Recent fall on Friday. Denies hitting head. Not on blood thinners. Describes episodes of "vision going black".

## 2022-03-25 NOTE — ED Notes (Signed)
ED Provider at bedside. 

## 2022-03-25 NOTE — ED Notes (Signed)
Daughter speaks English and at bedside. Pt requests translator to talk to provider.

## 2022-03-26 ENCOUNTER — Emergency Department (HOSPITAL_COMMUNITY): Payer: Medicare Other

## 2022-03-26 DIAGNOSIS — R519 Headache, unspecified: Secondary | ICD-10-CM | POA: Diagnosis not present

## 2022-03-26 LAB — C-REACTIVE PROTEIN: CRP: 0.6 mg/dL (ref <1.0)

## 2022-03-26 MED ORDER — GADOBUTROL 1 MMOL/ML IV SOLN
7.0000 mL | Freq: Once | INTRAVENOUS | Status: AC | PRN
  Administered 2022-03-26: 7 mL via INTRAVENOUS

## 2022-03-26 NOTE — ED Notes (Signed)
Patient states the wait is long and he is leaving 

## 2022-10-22 DIAGNOSIS — N529 Male erectile dysfunction, unspecified: Secondary | ICD-10-CM | POA: Insufficient documentation

## 2022-10-30 ENCOUNTER — Encounter (HOSPITAL_COMMUNITY): Payer: Self-pay | Admitting: *Deleted

## 2022-10-30 ENCOUNTER — Ambulatory Visit (HOSPITAL_COMMUNITY)
Admission: EM | Admit: 2022-10-30 | Discharge: 2022-10-30 | Disposition: A | Discharge status: Home / Self Care | Payer: 59 | Attending: Internal Medicine | Admitting: Internal Medicine

## 2022-10-30 ENCOUNTER — Emergency Department (HOSPITAL_COMMUNITY)
Admission: EM | Admit: 2022-10-30 | Discharge: 2022-10-30 | Disposition: A | Discharge status: Home / Self Care | Payer: 59 | Attending: Emergency Medicine | Admitting: Emergency Medicine

## 2022-10-30 ENCOUNTER — Other Ambulatory Visit: Payer: Self-pay

## 2022-10-30 ENCOUNTER — Ambulatory Visit (INDEPENDENT_AMBULATORY_CARE_PROVIDER_SITE_OTHER): Payer: 59

## 2022-10-30 ENCOUNTER — Emergency Department (HOSPITAL_COMMUNITY): Payer: 59

## 2022-10-30 DIAGNOSIS — K5641 Fecal impaction: Secondary | ICD-10-CM

## 2022-10-30 DIAGNOSIS — K5900 Constipation, unspecified: Secondary | ICD-10-CM

## 2022-10-30 DIAGNOSIS — R1084 Generalized abdominal pain: Secondary | ICD-10-CM | POA: Insufficient documentation

## 2022-10-30 DIAGNOSIS — K6289 Other specified diseases of anus and rectum: Secondary | ICD-10-CM | POA: Diagnosis not present

## 2022-10-30 LAB — URINALYSIS, ROUTINE W REFLEX MICROSCOPIC
Bilirubin Urine: NEGATIVE
Glucose, UA: NEGATIVE mg/dL
Hgb urine dipstick: NEGATIVE
Ketones, ur: NEGATIVE mg/dL
Leukocytes,Ua: NEGATIVE
Nitrite: NEGATIVE
Protein, ur: NEGATIVE mg/dL
Specific Gravity, Urine: 1.019 (ref 1.005–1.030)
pH: 6 (ref 5.0–8.0)

## 2022-10-30 LAB — COMPREHENSIVE METABOLIC PANEL
ALT: 18 U/L (ref 0–44)
AST: 20 U/L (ref 15–41)
Albumin: 3.9 g/dL (ref 3.5–5.0)
Alkaline Phosphatase: 49 U/L (ref 38–126)
Anion gap: 9 (ref 5–15)
BUN: 21 mg/dL (ref 8–23)
CO2: 24 mmol/L (ref 22–32)
Calcium: 9 mg/dL (ref 8.9–10.3)
Chloride: 101 mmol/L (ref 98–111)
Creatinine, Ser: 0.87 mg/dL (ref 0.61–1.24)
GFR, Estimated: >60 mL/min (ref >60)
Glucose, Bld: 90 mg/dL (ref 70–99)
Potassium: 4.2 mmol/L (ref 3.5–5.1)
Sodium: 134 mmol/L — ABNORMAL LOW (ref 135–145)
Total Bilirubin: 1.1 mg/dL (ref 0.3–1.2)
Total Protein: 7.2 g/dL (ref 6.5–8.1)

## 2022-10-30 LAB — CBC
HCT: 44.8 % (ref 39.0–52.0)
Hemoglobin: 15.1 g/dL (ref 13.0–17.0)
MCH: 34.2 pg — ABNORMAL HIGH (ref 26.0–34.0)
MCHC: 33.7 g/dL (ref 30.0–36.0)
MCV: 101.4 fL — ABNORMAL HIGH (ref 80.0–100.0)
Platelets: 203 10*3/uL (ref 150–400)
RBC: 4.42 MIL/uL (ref 4.22–5.81)
RDW: 12.9 % (ref 11.5–15.5)
WBC: 8.7 10*3/uL (ref 4.0–10.5)
nRBC: 0 % (ref 0.0–0.2)

## 2022-10-30 LAB — LIPASE, BLOOD: Lipase: 34 U/L (ref 11–51)

## 2022-10-30 MED ORDER — LACTULOSE 10 GM/15ML PO SOLN: 20.0000 g | Freq: Two times a day (BID) | ORAL | 0 refills | Status: DC | PRN

## 2022-10-30 MED ORDER — LIDOCAINE HCL URETHRAL/MUCOSAL 2 % EX GEL
1.0000 | Freq: Once | CUTANEOUS | Status: AC
  Administered 2022-10-30: 1 via TOPICAL
  Filled 2022-10-30: qty 11

## 2022-10-30 MED ORDER — FLEET ENEMA 7-19 GM/118ML RE ENEM
1.0000 | ENEMA | Freq: Once | RECTAL | Status: AC
  Administered 2022-10-30: 1 via RECTAL
  Filled 2022-10-30: qty 1

## 2022-10-30 MED ORDER — SODIUM CHLORIDE 0.9 % IV BOLUS
500.0000 mL | Freq: Once | INTRAVENOUS | Status: AC
  Administered 2022-10-30: 500 mL via INTRAVENOUS

## 2022-10-30 MED ORDER — IOHEXOL 350 MG/ML SOLN
75.0000 mL | Freq: Once | INTRAVENOUS | Status: AC | PRN
  Administered 2022-10-30: 75 mL via INTRAVENOUS

## 2022-10-30 MED ORDER — FENTANYL CITRATE PF 50 MCG/ML IJ SOSY
50.0000 ug | PREFILLED_SYRINGE | Freq: Once | INTRAMUSCULAR | Status: AC
  Administered 2022-10-30: 50 ug via INTRAVENOUS
  Filled 2022-10-30: qty 1

## 2022-10-30 NOTE — ED Provider Notes (Signed)
[place] EMERGENCY DEPARTMENT AT [facility] Provider Note   CSN: [id number] Arrival date & time: 10/30/22  1726     History  Chief Complaint  Patient presents with   constipated    Shane Melton is a 82 y.o. male.  History is via iPad interpreter in [place].  Wife is helping as he is very hard of hearing.  He has had abdominal pain rectal pain and constipation for the last few days.  Today he has not been able to urinate either.  They went to urgent care who sent him here for further evaluation.  He has had some rectal bleeding due to trying to manually disimpact himself.  Does not usually have constipation.  No fevers chills nausea vomiting.  1 prior abdominal surgery 20+ years ago for ?appendicitis.  The history is provided by the patient and the spouse. The history is limited by a language barrier. A language interpreter was used.  Abdominal Pain Pain location:  Generalized Pain quality: aching and cramping   Pain radiates to:  Anus Pain severity:  Severe Onset quality:  Gradual Timing:  Constant Progression:  Worsening Chronicity:  New Relieved by:  Nothing Worsened by:  Nothing Ineffective treatments:  None tried Associated symptoms: constipation   Associated symptoms: no chest pain, no cough, no diarrhea, no dysuria, no fever, no nausea, no shortness of breath and no vomiting        Home Medications Prior to Admission medications   Medication Sig Start Date End Date Taking? Authorizing Provider  acetaminophen (TYLENOL) 500 MG tablet Take 1 tablet (500 mg total) by mouth every 6 (six) hours as needed. 12/24/21   Rising, Lurena Joiner, PA-C  diclofenac Sodium (VOLTAREN) 1 % GEL Apply 2 g topically 4 (four) times daily. 12/24/21   Rising, Lurena Joiner, PA-C  tamsulosin (FLOMAX) 0.4 MG CAPS capsule Take 0.4 mg by mouth daily. 11/07/21   [provider]      Allergies    Patient has no known allergies.    Review of Systems   Review of Systems  Constitutional:   Negative for fever.  Respiratory:  Negative for cough and shortness of breath.   Cardiovascular:  Negative for chest pain.  Gastrointestinal:  Positive for abdominal pain and constipation. Negative for diarrhea, nausea and vomiting.  Genitourinary:  Negative for dysuria.    Physical Exam Updated Vital Signs BP (!) 150/82 (BP Location: Right Arm)   Pulse 68   Temp 97.8 F (36.6 C) (Oral)   Resp 16   Ht 5\' 5"  (1.651 m)   Wt 72.1 kg   SpO2 99%   BMI 26.45 kg/m  Physical Exam Vitals and nursing note reviewed.  Constitutional:      General: He is not in acute distress.    Appearance: Normal appearance. He is well-developed.  HENT:     Head: Normocephalic and atraumatic.  Eyes:     Conjunctiva/sclera: Conjunctivae normal.  Cardiovascular:     Rate and Rhythm: Normal rate and regular rhythm.     Heart sounds: No murmur heard. Pulmonary:     Effort: Pulmonary effort is normal. No respiratory distress.     Breath sounds: Normal breath sounds.  Abdominal:     Palpations: Abdomen is soft.     Tenderness: There is abdominal tenderness. There is no guarding or rebound.  Musculoskeletal:        General: No deformity. Normal range of motion.     Cervical back: Neck supple.  Skin:  General: Skin is warm and dry.     Capillary Refill: Capillary refill takes less than 2 seconds.  Neurological:     General: No focal deficit present.     Mental Status: He is alert.     ED Results / Procedures / Treatments   Labs (all labs ordered are listed, but only abnormal results are displayed) Labs Reviewed  COMPREHENSIVE METABOLIC PANEL - Abnormal; Notable for the following components:      Result Value   Sodium 134 (*)    All other components within normal limits  CBC - Abnormal; Notable for the following components:   MCV 101.4 (*)    MCH 34.2 (*)    All other components within normal limits  LIPASE, BLOOD  URINALYSIS, ROUTINE W REFLEX MICROSCOPIC    EKG None  Radiology CT  ABDOMEN PELVIS W CONTRAST  Result Date: 10/30/2022 CLINICAL DATA:  Acute abdominal pain EXAM: CT ABDOMEN AND PELVIS WITH CONTRAST TECHNIQUE: Multidetector CT imaging of the abdomen and pelvis was performed using the standard protocol following bolus administration of intravenous contrast. RADIATION DOSE REDUCTION: This exam was performed according to the departmental dose-optimization program which includes automated exposure control, adjustment of the mA and/or kV according to patient size and/or use of iterative reconstruction technique. CONTRAST:  75mL OMNIPAQUE IOHEXOL 350 MG/ML SOLN COMPARISON:  None Available. FINDINGS: Lower chest: Mild atelectatic changes are noted in the right base. Hepatobiliary: No focal liver abnormality is seen. No gallstones, gallbladder wall thickening, or biliary dilatation. Pancreas: Unremarkable. No pancreatic ductal dilatation or surrounding inflammatory changes. Spleen: Normal in size without focal abnormality. Adrenals/Urinary Tract: Adrenal glands are within normal limits. Kidneys are well visualized bilaterally. Renal cysts are noted bilaterally which appear simple in nature. No further follow-up is recommended. No renal calculi or obstructive changes are seen. Normal excretion is noted on delayed images. The bladder is well distended. Stomach/Bowel: Fecal material scattered throughout the colon. Mild wall thickening is noted in the rectum which may represent some mild stercoral colitis. No obstructive or inflammatory changes of the colon are noted. The appendix has been surgically removed. The appendiceal stump is within normal limits. Small bowel and stomach are unremarkable. Vascular/Lymphatic: Aortic atherosclerosis. No enlarged abdominal or pelvic lymph nodes. Reproductive: Prostate is unremarkable. Other: No abdominal wall hernia or abnormality. No abdominopelvic ascites. Musculoskeletal: Degenerative changes of the lumbar spine are noted. IMPRESSION: Mild inflammatory  changes in the distal rectum consistent with mild stercoral colitis. No obstructive changes are seen. No other focal abnormality is noted. Electronically Signed   By: Alcide Clever M.D.   On: 10/30/2022 22:02   DG Abd 2 Views  Result Date: 10/30/2022 CLINICAL DATA:  Constipation. EXAM: ABDOMEN - 2 VIEW COMPARISON:  None Available. FINDINGS: Supine and upright views of the abdomen obtained. No free intra-abdominal air. Diffuse moderate colonic stool burden. No bowel air-fluid levels. No small bowel distension. Moderate stool in the rectum with rectal distention of 6.6 cm. No visible radiopaque calculi. No acute lung base findings. Diffuse thoracic and lumbar spondylosis. IMPRESSION: 1. Diffuse moderate colonic stool burden, consistent with constipation. Moderate stool in the rectum with rectal distention of 6.6 cm. Recommend clinical correlation for signs of fecal impaction. 2. No bowel obstruction. Electronically Signed   By: Narda Rutherford M.D.   On: 10/30/2022 16:55    Procedures Fecal disimpaction  Date/Time: 10/30/2022 7:58 PM  Performed by: Terrilee Files, MD Authorized by: Terrilee Files, MD  Consent: Verbal consent obtained. Consent given by:  patient Patient understanding: patient states understanding of the procedure being performed Patient identity confirmed: verbally with patient Local anesthesia used: no  Anesthesia: Local anesthesia used: no  Sedation: Patient sedated: no  Patient tolerance: patient tolerated the procedure well with no immediate complications       Medications Ordered in ED Medications  fentaNYL (SUBLIMAZE) injection 50 mcg (has no administration in time range)  sodium chloride 0.9 % bolus 500 mL (has no administration in time range)  lidocaine (XYLOCAINE) 2 % jelly 1 Application (has no administration in time range)    ED Course/ Medical Decision Making/ A&P Clinical Course as of 10/31/22 0852  Wed Oct 30, 2022  1915 Patient had a KUB x-ray  done earlier today showing moderate stool moderate rectal dilatation possible fecal impaction [MB]  2001 She was able to spontaneously void 300 cc and had a 0 postvoid residual [MB]    Clinical Course User Index [MB] Terrilee Files, MD                             Medical Decision Making Amount and/or Complexity of Data Reviewed Radiology: ordered.  Risk OTC drugs. Prescription drug management.   This patient complains of abdominal pain rectal pain constipation; this involves an extensive number of treatment Options and is a complaint that carries with it a high risk of complications and morbidity. The differential includes constipation, fecal impaction, diverticulitis, colitis, obstruction  I ordered, reviewed and interpreted labs, which included CBC unremarkable chemistries and LFTs unremarkable urinalysis without signs of infection I ordered medication IV fluids and pain medication and reviewed PMP when indicated. I ordered imaging studies which included CT abdomen and pelvis and I independently    visualized and interpreted imaging which showed rectal inflammatory changes Additional history obtained from patient's wife Previous records obtained and reviewed in epic including urgent care visit today Cardiac monitoring reviewed, normal sinus rhythm Social determinants considered, no significant barriers Critical Interventions: None  After the interventions stated above, I reevaluated the patient and found patient to be symptomatically improved Admission and further testing considered, no indications for admission or further workup at this time.  Patient did not have any real results with his enema but no signs of obstruction.  Will try him with oral medications and allow him to go home, recommended close follow-up with PCP.  Return instructions reviewed with patient via iPad interpreter         Final Clinical Impression(s) / ED Diagnoses Final diagnoses:  Generalized  abdominal pain  Constipation, unspecified constipation type    Rx / DC Orders ED Discharge Orders          Ordered    lactulose (CHRONULAC) 10 GM/15ML solution  2 times daily PRN        10/30/22 2227              Terrilee Files, MD 10/31/22 [phone]

## 2022-10-30 NOTE — ED Notes (Signed)
Bladder scan volume = 0ml.

## 2022-10-30 NOTE — ED Notes (Signed)
EDP and RN at bedside for rectal exam and disimpaction.

## 2022-10-30 NOTE — ED Triage Notes (Signed)
The pt and wife only speak forean intrepreter machine at  the bedside

## 2022-10-30 NOTE — ED Provider Notes (Signed)
[facility]    CSN: [id number] Arrival date & time: 10/30/22  1501      History   Chief Complaint No chief complaint on file.   HPI Shane Melton is a 82 y.o. male.   Patient presents to urgent care with his wife who contributes to the history for evaluation of abdominal pain and constipation for 5 days. States he has been unable to defecate for 5 days but is having urge to have a bowel movement. He felt he may have a fecal impaction, so he attempted to relieve this himself and was able to "scrape out some hard stool" but was still unable to have a BM after this. Fecal disimpaction at home caused some bleeding to rectal tissue. No nausea, vomiting, diarrhea, back pain, fever, chills, or dizziness. No recent abdominal surgeries. He purchased Miralax and colace OTC but has not taken these yet.   The history is provided by the patient. The history is limited by a language barrier. A language interpreter was used [place] medical interpreter).    Past Medical History:  Diagnosis Date   Syncope, near 04/03/2015    Patient Active Problem List   Diagnosis Date Noted   Memory loss 01/06/2016   Dizziness and giddiness 01/06/2016   Syncope, near 04/03/2015   Eczema 06/29/2013    Past Surgical History:  Procedure Laterality Date   APPENDECTOMY     surgery on finger         Home Medications    Prior to Admission medications   Medication Sig Start Date End Date Taking? Authorizing Provider  tamsulosin (FLOMAX) 0.4 MG CAPS capsule Take 0.4 mg by mouth daily. 11/07/21  Yes [provider]  lactulose (CHRONULAC) 10 GM/15ML solution Take 30 mLs (20 g total) by mouth 2 (two) times daily as needed for mild constipation. 10/30/22   Terrilee Files, MD    Family History Family History  Problem Relation Age of Onset   Throat cancer Mother        laryngeal cancer   Stomach cancer Brother 38   Dementia Sister 68    Social History Social History   Tobacco  Use   Smoking status: Never   Smokeless tobacco: Never  Vaping Use   Vaping Use: Never used  Substance Use Topics   Alcohol use: No    Alcohol/week: 0.0 standard drinks of alcohol   Drug use: No     Allergies   Patient has no known allergies.   Review of Systems Review of Systems Per HPI  Physical Exam Triage Vital Signs ED Triage Vitals [10/30/22 1609]  Enc Vitals Group     BP (!) 141/72     Pulse Rate 67     Resp 18     Temp 98 F (36.7 C)     Temp Source Oral     SpO2 95 %     Weight      Height      Head Circumference      Peak Flow      Pain Score      Pain Loc      Pain Edu?      Excl. in GC?    No data found.  Updated Vital Signs BP (!) 141/72 (BP Location: Right Arm)   Pulse 67   Temp 98 F (36.7 C) (Oral)   Resp 18   SpO2 95%   Visual Acuity Right Eye Distance:   Left Eye Distance:  Bilateral Distance:    Right Eye Near:   Left Eye Near:    Bilateral Near:     Physical Exam Vitals and nursing note reviewed.  Constitutional:      Appearance: He is ill-appearing. He is not toxic-appearing.  HENT:     Head: Normocephalic and atraumatic.     Right Ear: Hearing and external ear normal.     Left Ear: Hearing and external ear normal.     Nose: Nose normal.     Mouth/Throat:     Lips: Pink.  Eyes:     General: Lids are normal. Vision grossly intact. Gaze aligned appropriately.     Extraocular Movements: Extraocular movements intact.     Conjunctiva/sclera: Conjunctivae normal.  Cardiovascular:     Rate and Rhythm: Normal rate and regular rhythm.     Heart sounds: Normal heart sounds, S1 normal and S2 normal.  Pulmonary:     Effort: Pulmonary effort is normal. No respiratory distress.     Breath sounds: Normal breath sounds and air entry.  Abdominal:     General: Bowel sounds are normal. There is distension.     Palpations: Abdomen is soft.     Tenderness: There is abdominal tenderness. There is no right CVA tenderness, left CVA  tenderness or guarding.     Comments: Generalized tenderness to the abdomen.  Musculoskeletal:     Cervical back: Neck supple.  Skin:    General: Skin is warm and dry.     Capillary Refill: Capillary refill takes less than 2 seconds.     Findings: No rash.  Neurological:     General: No focal deficit present.     Mental Status: He is alert and oriented to person, place, and time. Mental status is at baseline.     Cranial Nerves: No dysarthria or facial asymmetry.  Psychiatric:        Mood and Affect: Mood normal.        Speech: Speech normal.        Behavior: Behavior normal.        Thought Content: Thought content normal.        Judgment: Judgment normal.      UC Treatments / Results  Labs (all labs ordered are listed, but only abnormal results are displayed) Labs Reviewed - No data to display  EKG   Radiology No results found.  Procedures Procedures (including critical care time)  Medications Ordered in UC Medications - No data to display  Initial Impression / Assessment and Plan / UC Course  I have reviewed the triage vital signs and the nursing notes.  Pertinent labs & imaging results that were available during my care of the patient were reviewed by me and considered in my medical decision making (see chart for details).  Fecal impaction, constipation Abdominal x-ray shows fecal impaction with significant stool burden. Patient will need a higher level of care in the ED for fecal disimpaction and likely enema/fluid administration. Discussed exam findings and imaging results/recommendations with patient and his wife through interpreter who express understanding and agreement to proceed to the ER for further evaluation and management of this.   Final Clinical Impressions(s) / UC Diagnoses   Final diagnoses:  Fecal impaction (HCC)  Constipation, unspecified constipation type   Discharge Instructions   None    ED Prescriptions   None    PDMP not reviewed  this encounter.   Carlisle Beers, [place] 11/05/22 1149

## 2022-10-30 NOTE — Discharge Instructions (Addendum)
You were seen in the emergency department for constipation.  You were disimpacted and given an enema.  Your lab work and CAT scan showed some inflammation in your rectum due to the constipation.  Please increase your fluid intake and we are prescribing some medication to use to have a bowel movement.  Follow-up with your regular doctor.  Return to the emergency department if any worsening or concerning symptoms

## 2022-10-30 NOTE — ED Triage Notes (Signed)
Adult nurse service used for clinical intake.   Pt states that he hasn't had a BM x 4-5 days, he is now having abdominal pain. He states he did use his hands and got some of the hard stool out of his rectum. When he dug the stool out of the rectum he states he seen some blood. He states that he is now unable to urinate due to amount of stool and abdominal pain. He hasn't urinated today at all.    He went to the pharmacy and they advised him to buy miralax and stool softener but he hasn't taken them yet.

## 2022-10-30 NOTE — ED Triage Notes (Signed)
The pt has had constipation for 5 days he has a history of the same   no n or v  [facility] and was told to come here for treatemnt

## 2022-10-30 NOTE — ED Notes (Signed)
Enema given, patient did not have a successful bowel movement after. EDP aware

## 2022-12-30 ENCOUNTER — Emergency Department (HOSPITAL_COMMUNITY)
Admission: EM | Admit: 2022-12-30 | Discharge: 2022-12-31 | Disposition: A | Discharge status: Home / Self Care | Payer: 59 | Attending: Emergency Medicine | Admitting: Emergency Medicine

## 2022-12-30 ENCOUNTER — Encounter (HOSPITAL_COMMUNITY): Payer: Self-pay

## 2022-12-30 ENCOUNTER — Other Ambulatory Visit: Payer: Self-pay

## 2022-12-30 ENCOUNTER — Emergency Department (HOSPITAL_COMMUNITY): Payer: 59

## 2022-12-30 DIAGNOSIS — R350 Frequency of micturition: Secondary | ICD-10-CM | POA: Diagnosis not present

## 2022-12-30 DIAGNOSIS — R4182 Altered mental status, unspecified: Secondary | ICD-10-CM | POA: Insufficient documentation

## 2022-12-30 DIAGNOSIS — R930 Abnormal findings on diagnostic imaging of skull and head, not elsewhere classified: Secondary | ICD-10-CM | POA: Insufficient documentation

## 2022-12-30 DIAGNOSIS — R410 Disorientation, unspecified: Secondary | ICD-10-CM | POA: Insufficient documentation

## 2022-12-30 DIAGNOSIS — I2510 Atherosclerotic heart disease of native coronary artery without angina pectoris: Secondary | ICD-10-CM | POA: Insufficient documentation

## 2022-12-30 DIAGNOSIS — D72819 Decreased white blood cell count, unspecified: Secondary | ICD-10-CM | POA: Diagnosis not present

## 2022-12-30 DIAGNOSIS — I700 Atherosclerosis of aorta: Secondary | ICD-10-CM | POA: Insufficient documentation

## 2022-12-30 DIAGNOSIS — Z1152 Encounter for screening for COVID-19: Secondary | ICD-10-CM | POA: Diagnosis not present

## 2022-12-30 DIAGNOSIS — R509 Fever, unspecified: Secondary | ICD-10-CM | POA: Diagnosis not present

## 2022-12-30 LAB — COMPREHENSIVE METABOLIC PANEL
ALT: 21 U/L (ref 0–44)
AST: 24 U/L (ref 15–41)
Albumin: 3.8 g/dL (ref 3.5–5.0)
Alkaline Phosphatase: 45 U/L (ref 38–126)
Anion gap: 15 (ref 5–15)
BUN: 26 mg/dL — ABNORMAL HIGH (ref 8–23)
CO2: 22 mmol/L (ref 22–32)
Calcium: 9.2 mg/dL (ref 8.9–10.3)
Chloride: 97 mmol/L — ABNORMAL LOW (ref 98–111)
Creatinine, Ser: 1.29 mg/dL — ABNORMAL HIGH (ref 0.61–1.24)
GFR, Estimated: 56 mL/min — ABNORMAL LOW (ref >60)
Glucose, Bld: 103 mg/dL — ABNORMAL HIGH (ref 70–99)
Potassium: 4.2 mmol/L (ref 3.5–5.1)
Sodium: 134 mmol/L — ABNORMAL LOW (ref 135–145)
Total Bilirubin: 0.9 mg/dL (ref 0.3–1.2)
Total Protein: 6.5 g/dL (ref 6.5–8.1)

## 2022-12-30 LAB — CBC
HCT: 42 % (ref 39.0–52.0)
Hemoglobin: 14.4 g/dL (ref 13.0–17.0)
MCH: 35.2 pg — ABNORMAL HIGH (ref 26.0–34.0)
MCHC: 34.3 g/dL (ref 30.0–36.0)
MCV: 102.7 fL — ABNORMAL HIGH (ref 80.0–100.0)
Platelets: 115 10*3/uL — ABNORMAL LOW (ref 150–400)
RBC: 4.09 MIL/uL — ABNORMAL LOW (ref 4.22–5.81)
RDW: 13.1 % (ref 11.5–15.5)
WBC: 3.6 10*3/uL — ABNORMAL LOW (ref 4.0–10.5)
nRBC: 0 % (ref 0.0–0.2)

## 2022-12-30 LAB — CBG MONITORING, ED: Glucose-Capillary: 114 mg/dL — ABNORMAL HIGH (ref 70–99)

## 2022-12-30 NOTE — ED Triage Notes (Signed)
Patient arrived POV with wife from home with sudden on set of dizziness at 0700 with increased dizziness through the day. Sometime after dizziness started per family patient had altered gait.   Family reports patient became confused this afternoon around 3pm

## 2022-12-31 ENCOUNTER — Other Ambulatory Visit (HOSPITAL_COMMUNITY): Payer: 59

## 2022-12-31 ENCOUNTER — Emergency Department (HOSPITAL_COMMUNITY): Payer: 59

## 2022-12-31 ENCOUNTER — Encounter (HOSPITAL_COMMUNITY): Payer: Self-pay | Admitting: *Deleted

## 2022-12-31 DIAGNOSIS — R4182 Altered mental status, unspecified: Secondary | ICD-10-CM | POA: Diagnosis not present

## 2022-12-31 LAB — CULTURE, BLOOD (ROUTINE X 2)
Culture: NO GROWTH
Culture: NO GROWTH

## 2022-12-31 LAB — SARS CORONAVIRUS 2 BY RT PCR: SARS Coronavirus 2 by RT PCR: NEGATIVE

## 2022-12-31 LAB — I-STAT CG4 LACTIC ACID, ED: Lactic Acid, Venous: 1.4 mmol/L (ref 0.5–1.9)

## 2022-12-31 LAB — PROTIME-INR
INR: 1.1 (ref 0.8–1.2)
Prothrombin Time: 14.4 seconds (ref 11.4–15.2)

## 2022-12-31 LAB — APTT: aPTT: 28 seconds (ref 24–36)

## 2022-12-31 MED ORDER — SODIUM CHLORIDE 0.9 % IV BOLUS
1000.0000 mL | Freq: Once | INTRAVENOUS | Status: AC
  Administered 2022-12-31: 1000 mL via INTRAVENOUS

## 2022-12-31 MED ORDER — ACETAMINOPHEN 325 MG PO TABS
650.0000 mg | ORAL_TABLET | Freq: Once | ORAL | Status: AC
  Administered 2022-12-31: 650 mg via ORAL
  Filled 2022-12-31: qty 2

## 2022-12-31 MED ORDER — IOHEXOL 350 MG/ML SOLN
75.0000 mL | Freq: Once | INTRAVENOUS | Status: AC | PRN
  Administered 2022-12-31: 75 mL via INTRAVENOUS

## 2022-12-31 NOTE — Discharge Instructions (Signed)
Today's initial tests have been reassuring, your MRI does not show evidence for a stroke, and your blood work is unremarkable.  With your confusion and headaches it is important to follow-up with our neurology colleagues.  If the office does not contact you for follow-up this week, please call.  Return here for any concerning changes in your condition.  ??? ?? ?? ??? ??? ???, MRI ?? ?? ??? ??? ??? ???, ?? ?? ??? ?? ????. ??? ??? ???? ??? ??? ?? ??? ?? ?? ?????. ?? ?? ????? ?? ??? ?? ???? ??? ??? ????. ??? ???? ??? ??? ??? ?? ????.

## 2022-12-31 NOTE — ED Provider Notes (Signed)
Care of the patient assumed at signout.  Onset of the patient was awaiting MRI results with plan for reassessment, consideration of lumbar puncture. 9:03 AM Patient awake, alert, moving his neck freely, has no current complaints beyond mild dizziness which has been present for quite some time.  Now with additional conversation with translator, daughter, wife, patient's symptoms have been discussed again, and they note that they have been going on for some time with an episode of confusion yesterday that was slightly atypical, but not entirely so.  We discussed lumbar puncture, given the patient's reassuring MRI, labs, consideration of infectious etiology though there is no clinical evidence for meningitis with the patient spontaneously moving his neck in all dimensions, and no evidence of meningismus. Patient, family, have a strong preference for close outpatient neurology follow-up rather than lumbar puncture today, and after several versions of discussing this, consideration of the fit of additional studies here versus close outpatient follow-up, the patient was discharged with neurology referral, and follow-up information.   Gerhard Munch, MD 12/31/22 [phone]

## 2022-12-31 NOTE — ED Provider Notes (Signed)
[place] EMERGENCY DEPARTMENT AT [facility] Provider Note   CSN: [id number] Arrival date & time: 12/30/22  2002     History  No chief complaint on file.   Shane Melton is a 82 y.o. male.  82 year old male brought in by for a from home, patient's wife and daughter are at bedside, patient prefers daughter to translate.  Patient's daughter reports patient began to feel unwell on Monday at 2 PM while he was watching someone repair their garage, he went inside to rest.  Patient's wife found him trying to shave his face although he had already shaved for the morning.  Also notes that he was trying to take her medication and arguing that it was his medication.  Family feels like his gait is not steady although does not tend to favor 1 side over the other.  Patient complains of intermittent headaches for the past month, more so when he bends over.  No recent falls or injuries.  Family does report urinary frequency with decreased urine output.       Home Medications Prior to Admission medications   Not on File      Allergies    Patient has no known allergies.    Review of Systems   Review of Systems Negative except as per HPI Physical Exam Updated Vital Signs BP (!) 114/51   Pulse 72   Temp 98.4 F (36.9 C) (Oral)   Resp 20   Ht 6' (1.829 m)   Wt 77.1 kg   SpO2 95%   BMI 23.06 kg/m  Physical Exam Vitals and nursing note reviewed.  Constitutional:      General: He is not in acute distress.    Appearance: He is well-developed. He is not diaphoretic.  HENT:     Head: Normocephalic and atraumatic.  Eyes:     Extraocular Movements: Extraocular movements intact.     Pupils: Pupils are equal, round, and reactive to light.  Cardiovascular:     Rate and Rhythm: Normal rate and regular rhythm.     Pulses: Normal pulses.     Heart sounds: Normal heart sounds.  Pulmonary:     Effort: Pulmonary effort is normal.     Breath sounds: Normal breath sounds.  Abdominal:      Palpations: Abdomen is soft.     Tenderness: There is no abdominal tenderness.  Musculoskeletal:     Cervical back: Neck supple.     Right lower leg: No edema.     Left lower leg: No edema.  Skin:    General: Skin is warm and dry.     Findings: No erythema or rash.  Neurological:     Mental Status: He is alert and oriented to person, place, and time.     Cranial Nerves: No cranial nerve deficit.     Sensory: No sensory deficit.     Motor: No weakness.     Coordination: Coordination normal.  Psychiatric:        Behavior: Behavior normal.     ED Results / Procedures / Treatments   Labs (all labs ordered are listed, but only abnormal results are displayed) Labs Reviewed  COMPREHENSIVE METABOLIC PANEL - Abnormal; Notable for the following components:      Result Value   Sodium 134 (*)    Chloride 97 (*)    Glucose, Bld 103 (*)    BUN 26 (*)    Creatinine, Ser 1.29 (*)    GFR, Estimated 56 (*)  All other components within normal limits  CBC - Abnormal; Notable for the following components:   WBC 3.6 (*)    RBC 4.09 (*)    MCV 102.7 (*)    MCH 35.2 (*)    Platelets 115 (*)    All other components within normal limits  URINALYSIS, W/ REFLEX TO CULTURE (INFECTION SUSPECTED) - Abnormal; Notable for the following components:   Hgb urine dipstick SMALL (*)    All other components within normal limits  CBG MONITORING, ED - Abnormal; Notable for the following components:   Glucose-Capillary 114 (*)    All other components within normal limits  SARS CORONAVIRUS 2 BY RT PCR  CULTURE, BLOOD (ROUTINE X 2)  CULTURE, BLOOD (ROUTINE X 2)  CSF CULTURE W GRAM STAIN  URINE CULTURE  PROTIME-INR  APTT  CSF CELL COUNT WITH DIFFERENTIAL  PROTEIN AND GLUCOSE, CSF  I-STAT CG4 LACTIC ACID, ED    EKG None  Radiology CT CHEST ABDOMEN PELVIS W CONTRAST  Result Date: 12/31/2022 CLINICAL DATA:  Sepsis and fever EXAM: CT CHEST, ABDOMEN, AND PELVIS WITH CONTRAST TECHNIQUE: Multidetector  CT imaging of the chest, abdomen and pelvis was performed following the standard protocol during bolus administration of intravenous contrast. RADIATION DOSE REDUCTION: This exam was performed according to the departmental dose-optimization program which includes automated exposure control, adjustment of the mA and/or kV according to patient size and/or use of iterative reconstruction technique. CONTRAST:  75mL OMNIPAQUE IOHEXOL 350 MG/ML SOLN COMPARISON:  Abdominal CT 10/30/2022 FINDINGS: CT CHEST FINDINGS Cardiovascular: Normal heart size. Aortic and coronary atherosclerosis. No pericardial effusion. No acute vascular finding Mediastinum/Nodes: Negative for mass or adenopathy. Lungs/Pleura: Symmetric hazy density in the dependent lungs with volume loss attributed atelectasis. There is no edema, consolidation, effusion, or pneumothorax. Musculoskeletal: No acute or aggressive finding CT ABDOMEN PELVIS FINDINGS Hepatobiliary: No focal liver abnormality.No evidence of biliary obstruction or stone. Pancreas: Unremarkable. Spleen: Unremarkable. Adrenals/Urinary Tract: Negative adrenals. No hydronephrosis or stone. Renal cysts with simple appearance, measuring up to 2 cm at the right lower pole. No follow-up imaging is recommended. Unremarkable bladder. Stomach/Bowel: No obstruction. No evidence of bowel inflammation. Diffuse colonic stool without over distension. Vascular/Lymphatic: No acute vascular abnormality. Atheromatous calcification of the aorta and iliacs. No mass or adenopathy. Reproductive:No pathologic findings. Other: No ascites or pneumoperitoneum. Musculoskeletal: No acute abnormalities. Generalized lumbar spine degeneration with notable foraminal impingement bilaterally at L2-3 (transitional S1 vertebra). IMPRESSION: No acute finding or explanation for sepsis history. Electronically Signed   By: Tiburcio Pea M.D.   On: 12/31/2022 04:27   DG Chest Port 1 View  Result Date: 12/31/2022 CLINICAL DATA:   Possible sepsis EXAM: PORTABLE CHEST 1 VIEW COMPARISON:  03/31/2019 FINDINGS: Cardiac shadow is stable. Tortuous thoracic aorta is noted. Mild bibasilar atelectasis is seen. No focal infiltrate or effusion is noted. IMPRESSION: Bibasilar atelectasis. Electronically Signed   By: Alcide Clever M.D.   On: 12/31/2022 01:20   CT Head Wo Contrast  Result Date: 12/31/2022 CLINICAL DATA:  Altered mental status EXAM: CT HEAD WITHOUT CONTRAST TECHNIQUE: Contiguous axial images were obtained from the base of the skull through the vertex without intravenous contrast. RADIATION DOSE REDUCTION: This exam was performed according to the departmental dose-optimization program which includes automated exposure control, adjustment of the mA and/or kV according to patient size and/or use of iterative reconstruction technique. COMPARISON:  03/25/2022 FINDINGS: Brain: No evidence of acute infarction, hemorrhage, hydrocephalus, extra-axial collection or mass lesion/mass effect. Vascular: No hyperdense vessel or unexpected  calcification. Skull: Normal. Negative for fracture or focal lesion. Sinuses/Orbits: Mucosal thickening is noted within the left maxillary antrum. This is stable from the previous exam. Other: None. IMPRESSION: No acute intracranial abnormality noted. Chronic sinusitis in the left maxillary antrum. Electronically Signed   By: Alcide Clever M.D.   On: 12/31/2022 00:16    Procedures Procedures    Medications Ordered in ED Medications  sodium chloride 0.9 % bolus 1,000 mL (0 mLs Intravenous Stopped 12/31/22 0420)  acetaminophen (TYLENOL) tablet 650 mg (650 mg Oral Given 12/31/22 0317)  iohexol (OMNIPAQUE) 350 MG/ML injection 75 mL (75 mLs Intravenous Contrast Given 12/31/22 0420)    ED Course/ Medical Decision Making/ A&P Clinical Course as of 12/31/22 0740  Tue Dec 31, 2022  0053 Surgical history- appendectomy Medication- pill for bladder, 81mg  ASA [LM]    Clinical Course User Index [LM] Jeannie Fend, PA-C                                 Medical Decision Making Amount and/or Complexity of Data Reviewed Labs: ordered. Radiology: ordered.  Risk OTC drugs. Prescription drug management.   This patient presents to the ED for concern of confusion, this involves an extensive number of treatment options, and is a complaint that carries with it a high risk of complications and morbidity.  The differential diagnosis includes but not limited to CVA, UTI, PNA, sepsis, covid, metabolic    Co morbidities that complicate the patient evaluation  HLD per chart review, patient reports only taking a pill daily for his bladder   Additional history obtained:  Additional history obtained from daughter and wife at bedside who contribute to history as above External records from outside source obtained and reviewed including prior labs on file (Cr 0.87 on 10/30/22)   Lab Tests:  I Ordered, and personally interpreted labs.  The pertinent results include:  lactic 1.4. CBC with mild leukopenia at 3.6. CMP with slight increase in Cr to 1.29. UA small hgb, no infection.    Imaging Studies ordered:  I ordered imaging studies including CT head, CXR, CT chest/abdomen/pelvis I independently visualized and interpreted imaging which showed CXR with bibasilar atelectasis. CT head without acute findings.  I agree with the radiologist interpretation   Cardiac Monitoring: / EKG:  The patient was maintained on a cardiac monitor.  I personally viewed and interpreted the cardiac monitored which showed an underlying rhythm of: sinus rhythm, rate 85   Consultations Obtained:  I requested consultation with the ER attendings Drs Clayborne Dana and Jeraldine Loots,  and discussed lab and imaging findings as well as pertinent plan - they recommend: agrees with plan of care   Problem List / ED Course / Critical interventions / Medication management  82 year old male brought in by family with concern for confusion today,  also notes headache intermittent for the past month, generally with bending over. Confusion gradually improving. Temp 99.6 on arrival, rechecked in the room about found to have fever of 101.7, sepsis orders initiated. Patient hemodynamically stable, WBC 3.6, cr slightly elevated compared to prior on file (see chart merge notification). No obvious source of infection on CXR, ct c/a/p, labs. Plan is for MRI brain for possible CVA as cause for confusion vs TIA vs meningitis.  I ordered medication including Tylenol  for fever  Reevaluation of the patient after these medicines showed that the patient improved I have reviewed the patients home  medicines and have made adjustments as needed   Social Determinants of Health:  Lives with family   Test / Admission - Considered:  Pending at time of sign out to Dr. Jeraldine Loots pending MRI and possible LP.         Final Clinical Impression(s) / ED Diagnoses Final diagnoses:  Confusion  Fever, unspecified fever cause    Rx / DC Orders ED Discharge Orders     None         Jeannie Fend, PA-C 12/31/22 0740    Mesner, Barbara Cower, MD 01/01/23 0006

## 2023-01-04 NOTE — ED Notes (Signed)
Attempted to call pts emergency contacts x2 regarding positive blood cultures, no answer

## 2023-01-04 NOTE — ED Notes (Signed)
Attempted to call pts emergency contacts again regarding positive blood cultures, no answer

## 2023-01-07 ENCOUNTER — Telehealth (HOSPITAL_BASED_OUTPATIENT_CLINIC_OR_DEPARTMENT_OTHER): Payer: Self-pay | Admitting: *Deleted

## 2023-01-07 NOTE — Telephone Encounter (Signed)
Post ED Visit - Positive Culture Follow-up  Culture report reviewed by antimicrobial stewardship pharmacist: [facility] [x]  Eldridge Scot, Pharm.D. []  Celedonio Miyamoto, Pharm.D., BCPS AQ-ID []  Garvin Fila, Pharm.D., BCPS []  Georgina Pillion, [address].D., BCPS []  [place], [address].D., BCPS, AAHIVP []  Estella Husk, Pharm.D., BCPS, AAHIVP []  Lysle Pearl, PharmD, BCPS []  Phillips Climes, PharmD, BCPS []  Agapito Games, PharmD, BCPS []  Verlan Friends, PharmD []  Mervyn Gay, PharmD, BCPS []  Vinnie Level, PharmD  [facility] []  Len Childs, PharmD []  Greer Pickerel, PharmD []  Adalberto Cole, PharmD []  Perlie Gold, Rph []  Lonell Face) Jean Rosenthal, PharmD []  Earl Many, PharmD []  Junita Push, PharmD []  Dorna Leitz, PharmD []  Terrilee Files, PharmD []  Lynann Beaver, PharmD []  Keturah Barre, PharmD []  Loralee Pacas, PharmD []  Bernadene Person, PharmD   Positive blood culture Likely contaminant and no further patient follow-up is required at this time. Theda Belfast, MD  Virl Axe Talley 01/07/2023, 10:47 AM

## 2023-01-13 ENCOUNTER — Encounter: Payer: Self-pay | Admitting: Neurology

## 2023-01-13 ENCOUNTER — Ambulatory Visit (INDEPENDENT_AMBULATORY_CARE_PROVIDER_SITE_OTHER): Payer: 59 | Admitting: Neurology

## 2023-01-13 VITALS — BP 118/67 | HR 71 | Wt 162.0 lb

## 2023-01-13 DIAGNOSIS — R413 Other amnesia: Secondary | ICD-10-CM | POA: Diagnosis not present

## 2023-01-13 DIAGNOSIS — R410 Disorientation, unspecified: Secondary | ICD-10-CM | POA: Diagnosis not present

## 2023-01-13 DIAGNOSIS — Z87898 Personal history of other specified conditions: Secondary | ICD-10-CM

## 2023-01-13 NOTE — Patient Instructions (Addendum)
It was nice to meet you today.  You have complaints of memory loss: memory loss or changes in cognitive function can have many reasons and does not always mean you have dementia.  There are several conditions and situations that can contribute to subjective or objective memory loss.  These factors include: depression, stress, sleep deprivation or poor sleep from insomnia or sleep apnea, dehydration, fluctuation in blood sugar values, thyroid or electrolyte dysfunction, medication effects from sedating medications or narcotic pain medication for example and certain vitamin deficiencies such as vitamin B12 deficiency, and anemia. Dementia can be caused by stroke, brain atherosclerosis or brain vascular disease due to vascular risk factors (smoking, high blood pressure, high cholesterol, obesity and uncontrolled diabetes), certain degenerative brain disorders (including Parkinson's disease and Multiple sclerosis) and by Alzheimer's disease or other, more rare and sometimes hereditary causes.   Here is what I would recommend:   blood work (which we will do today).  You have had a recent brain scan, called MRI and I do not believe you need another scan quite yet If you have intermittent confusion, I do not recommend that you drive for now.  You can have your family monitor your driving We can consider a formal cognitive test called neuropsychological evaluation which is done by a licensed neuropsychologist.  Dizziness and confusion can also be from medication effect.  Please talk to your prescribing physician about the possibility that some of your symptoms may be from the tamsulosin.

## 2023-01-13 NOTE — Progress Notes (Signed)
Subjective:    Patient ID: Shane Melton is a 82 y.o. male.  HPI    Shane Foley, MD, PhD [facility] [address] [address] [place], [place] [address]  I saw patient, Shane Melton as a referral from the emergency room for evaluation of confusion.  The patient is accompanied by a [place] interpreter today.  [name] is an 82 year old male with an underlying medical history of near syncope, prostate hypertrophy, who reports an approximately 1 year history of short-term memory loss.  He is forgetful, he has had intermittent confusion.  He presented to the emergency room on 12/30/2022 with confusion.  I reviewed the emergency room records.  He also presented to the emergency room after a fall in June.  He had abdominal pain and significant constipation at the time.  He reports that his constipation is better and constipation,.  He had a brain MRI without contrast on 12/31/2022 and I reviewed the results:   IMPRESSION: 1. No evidence of an acute intracranial abnormality. 2. 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 3. Paranasal sinus disease as described.   In addition, I reviewed the images through the PACS system.  He had a head CT without contrast on 12/31/2022 and I reviewed the results:  IMPRESSION: No acute intracranial abnormality noted.   Chronic sinusitis in the left maxillary antrum.   He had an MRI of the brain with and without contrast as well as orbital MRI with and without contrast on 03/25/2022 with indication of vision loss, concern for posterior circulation TIA or infarcts, I reviewed the results:  IMPRESSION: 1. No acute intracranial process. No evidence of acute or subacute infarct. 2. No traumatic or inflammatory finding in the orbits. No abnormal enhancement.  He had a TSH about a year ago and it was normal at the time. I do not see a recent vitamin B12  level.  He reports that he drives.  He reports no issues driving.  He reports having dizziness.  Of note, he takes Flomax, has been on it for about 18 months.  He lives with his wife, he has 3 grown children, 1 lives in [place], 1 in [place] and 1L in [place].  He is a non-smoker, he does not drink any alcohol.  He drinks caffeine in limitation, 1 cup of coffee in the morning.  His mom died of cancer at 22, his dad died at 66, unclear medical reason.  He had a total of 4 siblings, 1 older sister is alive at age 70, 3 siblings passed away.  His Past Medical History Is Significant For: Past Medical History:  Diagnosis Date   Syncope, near 04/03/2015    His Past Surgical History Is Significant For: Past Surgical History:  Procedure Laterality Date   APPENDECTOMY     surgery on finger      His Family History Is Significant For: Family History  Problem Relation Age of Onset   Throat cancer Mother        laryngeal cancer   Stomach cancer Brother 72   Dementia Sister 62    His Social History Is Significant For: Social History   Socioeconomic History   Marital status: Married    Spouse name: Not on file   Number of children: 3   Years of education: Not on file   Highest education level: Not on file  Occupational History   Not  on file  Tobacco Use   Smoking status: Never   Smokeless tobacco: Never  Vaping Use   Vaping status: Never Used  Substance and Sexual Activity   Alcohol use: No    Alcohol/week: 0.0 standard drinks of alcohol   Drug use: No   Sexual activity: Not on file  Other Topics Concern   Not on file  Social History Narrative   ** Merged History Encounter **       Social Determinants of Health   Financial Resource Strain: Low Risk  (06/21/2022)   Received from [facility], [facility]   Overall Financial Resource Strain (CARDIA)    Difficulty of Paying Living Expenses: Not very hard  Food Insecurity: Food Insecurity Present (06/21/2022)   Received  from [facility], [facility]   Hunger Vital Sign    Worried About Running Out of Food in the Last Year: Sometimes true    Ran Out of Food in the Last Year: Sometimes true  Transportation Needs: No Transportation Needs (06/21/2022)   Received from [facility], [facility]   PRAPARE - Transportation    Lack of Transportation (Medical): No    Lack of Transportation (Non-Medical): No  Physical Activity: Insufficiently Active (07/18/2021)   Received from [facility], [facility]   Exercise Vital Sign    Days of Exercise per Week: 2 days    Minutes of Exercise per Session: 20 min  Stress: Stress Concern Present (07/18/2021)   Received from [facility], [facility] - Occupational Stress Questionnaire    Feeling of Stress : Rather much  Social Connections: Unknown (09/20/2021)   Received from [facility], [facility]   Social Network    Social Network: Not on file    His Allergies Are:  No Known Allergies:   His Current Medications Are:  Outpatient Encounter Medications as of 01/13/2023  Medication Sig   tamsulosin (FLOMAX) 0.4 MG CAPS capsule Take 0.4 mg by mouth daily.   [DISCONTINUED] lactulose (CHRONULAC) 10 GM/15ML solution Take 30 mLs (20 g total) by mouth 2 (two) times daily as needed for mild constipation.   No facility-administered encounter medications on file as of 01/13/2023.  :   Review of Systems:  Out of a complete 14 point review of systems, all are reviewed and negative with the exception of these symptoms as listed below:   Review of Systems  Neurological:        2 months ago he woke up to bathroom had dizziness and fell. Went to ER and the completed work up. Was doing ok until 2 weeks ago a technician had came to work on the garage and he was out watching them and developed slowness with speech and dizziness but no fall this time. He has been battling with the dizziness the entire time. That never really went  away. He has dizziness all the time. He complains of headache that is more prominent when he is having to focus and the headache. When he feels stressed out the headache is worse. Asked about his memory and he feels that this is worsening since the most recent incident 2 weeks ago.     Objective:  Neurological Exam  Physical Exam Physical Examination:   Vitals:   01/13/23 1320  BP: 118/67  Pulse: 71    General Examination: The patient is a very pleasant 82 y.o. male in no acute distress. He appears well-developed and well-nourished and well groomed.   HEENT:  Normocephalic, atraumatic, pupils are equal, round and reactive to light, extraocular tracking is good without limitation to gaze excursion or nystagmus noted. Hearing is grossly intact. Face is symmetric with normal facial animation. Speech is clear with no dysarthria noted. There is no hypophonia. There is no lip, neck/head, jaw or voice tremor. Neck is supple with full range of passive and active motion. There are no carotid bruits on auscultation. Oropharynx exam reveals: Mild to moderate mouth dryness, full dentures.  Tongue protrudes centrally and palate elevates symmetrically.    Chest: Clear to auscultation without wheezing, rhonchi or crackles noted.  Heart: S1+S2+0, regular and normal without murmurs, rubs or gallops noted.   Abdomen: Soft, non-tender and non-distended.  Extremities: There is no obvious  edema in the distal lower extremities bilaterally.  Missing fingertips right hand digit 3 and 4, patient reports a lawnmower accident in the past.  Skin: Warm and dry without trophic changes noted.   Musculoskeletal: exam reveals no obvious joint deformities.   Neurologically:  Mental status: The patient is awake, alert and oriented in all 4 spheres. His immediate and remote memory, attention, language skills and fund of knowledge are fair.  He does not provide a whole lot of information.  Speech seems clear without  dysarthria. Mood is constricted and affect appears blunted.  However, there is a language barrier. Cranial nerves II - XII are as described above under HEENT exam.  Motor exam: Normal bulk, strength and tone is noted. There is no obvious action or resting tremor.  Fine motor skills and coordination: grossly intact.  Cerebellar testing: No dysmetria or intention tremor. There is no truncal or gait ataxia.  Sensory exam: intact to light touch in the upper and lower extremities.  Gait, station and balance: He stands without difficulty and does not require assistance.  He walks without a walking cane, no shuffling, preserved arm swing noted.  Assessment and Plan:   In summary, Londyn Coday is a very pleasant 82 y.o.-year old male with an underlying medical history of near syncope, prostate hypertrophy, who presents for evaluation of his episodes of confusion for the past year.  He has had some short-term memory issues as I understand.  He had a recent ER visit.  He had a recent brain MRI which I reviewed.  He is advised to proceed with additional testing in the form of blood work today.  We talked about the importance of maintaining a healthy lifestyle and monitoring him closely, since he has had episodes of confusion I advised him not to drive until his confusion is resolved.  He is encouraged to have his family monitor his ability to drive.  I do not believe he needs another brain scan quite yet.  We will do a EEG through our office as well as he has had intermittent confusion.  We will call him with his test results.  We will plan to follow-up in this clinic in about 3 months, we will plan an MMSE hopefully at the time.   I answered all his questions today and he was in agreement with our plan.  He is encouraged to bring one of his family members for his next visit.  He is furthermore advised to talk to his primary care about the possibility of having dizziness and near syncopal symptoms from taking  tamsulosin.  Thank you very much for allowing me to participate in the care of this nice patient. If I can be of any further assistance to you  please do not hesitate to call me at [phone].  Sincerely,   Shane Foley, MD, PhD

## 2023-01-14 LAB — TSH: TSH: 3.48 u[IU]/mL (ref 0.450–4.500)

## 2023-01-14 LAB — B12 AND FOLATE PANEL
Folate: 16.1 ng/mL (ref >3.0)
Vitamin B-12: 667 pg/mL (ref 232–1245)

## 2023-01-23 ENCOUNTER — Ambulatory Visit (INDEPENDENT_AMBULATORY_CARE_PROVIDER_SITE_OTHER): Payer: 59 | Admitting: Neurology

## 2023-01-23 DIAGNOSIS — R4182 Altered mental status, unspecified: Secondary | ICD-10-CM | POA: Diagnosis not present

## 2023-01-23 DIAGNOSIS — R413 Other amnesia: Secondary | ICD-10-CM

## 2023-01-23 DIAGNOSIS — Z87898 Personal history of other specified conditions: Secondary | ICD-10-CM

## 2023-01-23 DIAGNOSIS — R410 Disorientation, unspecified: Secondary | ICD-10-CM

## 2023-01-23 NOTE — Procedures (Signed)
    History:  82 year old man with intermittent confusion and syncope  EEG classification: Awake and drowsy  Duration: 25 minutes   Technical aspects: This EEG study was done with scalp electrodes positioned according to the 10-20 International system of electrode placement. Electrical activity was reviewed with band pass filter of 1-70Hz , sensitivity of 7 uV/mm, display speed of 42mm/sec with a 60Hz  notched filter applied as appropriate. EEG data were recorded continuously and digitally stored.   Description of the recording: The background rhythms of this recording consists of a fairly well modulated medium amplitude alpha rhythm of 9 Hz that is reactive to eye opening and closure. Present in the anterior head region is a 15-20 Hz beta activity. Photic stimulation was performed, did not show any abnormalities. Hyperventilation was also performed, did not show any abnormalities. Drowsiness was manifested by background fragmentation. No abnormal epileptiform discharges seen during this recording. There was no focal slowing. There were no electrographic seizure identified.   Abnormality: None   Impression: This is a normal EEG recorded while drowsy and awake. No evidence of interictal epileptiform discharges. Normal EEGs, however, do not rule out epilepsy.    Windell Norfolk, MD [facility]

## 2023-02-03 ENCOUNTER — Telehealth: Payer: Self-pay | Admitting: Neurology

## 2023-02-03 NOTE — Telephone Encounter (Signed)
Noted  

## 2023-02-03 NOTE — Telephone Encounter (Signed)
As you have already relayed, there could be numerous causes for new onset headaches including blood pressure fluctuation, dehydration, sinus pressure, medication side effects.  I recommend they discuss with PCP first.

## 2023-02-03 NOTE — Telephone Encounter (Signed)
I called the patient's daughter and spoke with her about his EEG results.  She verbalized understanding.  She asked for them to be sent over to primary care, Dr. Cathie Hoops.  She said they have an appointment with primary care tomorrow.  She mentioned he is still having concerns of headaches.  She states they have been happening for a few months. Sometimes they are mild and sometimes they are strong.  I recommended that she discuss this with primary care tomorrow.  I advised that according to the MRI brain from August, there were no acute concerns like a tumor or bleeding for example.  I advised that the MRI did show a lot of sinus mucous, etc and they may want to discuss this with primary care.  Patient potentially may need ENT.  I let her know the message will be sent to Dr. Frances Furbish.  If primary care feels patient needs to see Dr. Frances Furbish, they can send a request.  She verbalized appreciation and understanding.  EEG results sent to Dr Cathie Hoops.

## 2023-02-03 NOTE — Telephone Encounter (Signed)
Pt's daughter is asking for a call with results to EEG for pt.

## 2023-02-14 ENCOUNTER — Ambulatory Visit (HOSPITAL_COMMUNITY)
Admission: EM | Admit: 2023-02-14 | Discharge: 2023-02-14 | Disposition: A | Discharge status: Home / Self Care | Payer: 59 | Attending: Internal Medicine | Admitting: Internal Medicine

## 2023-02-14 ENCOUNTER — Encounter (HOSPITAL_COMMUNITY): Payer: Self-pay

## 2023-02-14 DIAGNOSIS — R21 Rash and other nonspecific skin eruption: Secondary | ICD-10-CM | POA: Diagnosis not present

## 2023-02-14 MED ORDER — AQUAPHOR EX OINT: TOPICAL_OINTMENT | CUTANEOUS | 0 refills | Status: DC | PRN

## 2023-02-14 MED ORDER — PREDNISONE 20 MG PO TABS: 40.0000 mg | ORAL_TABLET | Freq: Every day | ORAL | 0 refills | Status: AC

## 2023-02-14 NOTE — ED Provider Notes (Signed)
[facility]    CSN: [id number] Arrival date & time: 02/14/23  1324      History   Chief Complaint Chief Complaint  Patient presents with   Rash    HPI Shane Melton is a 82 y.o. male.   Shane Melton is a 82 y.o. male presenting for chief complaint of rash for the last 20 years that has worsened over the last 2-3 months. He has never been seen by dermatology for this rash. No sick contacts. No drainage/oozing from rash. No recent medication changes, antibiotic/steroid use, or chronic illnesses.   The history is provided by the patient and the spouse. The history is limited by a language barrier. A language interpreter was used (video [place] interpreter used for entirety of patient encounter).  Rash Location:  Full body Quality: itchiness   Quality: not blistering, not bruising, not burning, not draining, not dry, not painful, not peeling, not red, not swelling and not weeping   Severity:  Moderate Onset quality:  Unable to specify Duration: 20 years, worsened over the last several months. Timing:  Constant (Constant itching) Progression:  Worsening Chronicity:  Chronic Context: not chemical exposure, not eggs, not exposure to similar rash, not food, not hot tub use, not insect bite/sting, not new detergent/soap (uses regular tide), not nuts, not plant contact, not pollen and not sick contacts   Relieved by:  Nothing Worsened by:  Nothing Ineffective treatments:  None tried Associated symptoms: no abdominal pain, no diarrhea, no fatigue, no fever, no headaches, no hoarse voice, no induration, no joint pain, no myalgias, no nausea, no sore throat, no throat swelling, no URI and not vomiting     Past Medical History:  Diagnosis Date   Syncope, near 04/03/2015    Patient Active Problem List   Diagnosis Date Noted   Erectile dysfunction 10/22/2022   Nocturia 10/10/2020   Vitamin D deficiency 01/26/2020   Hyperlipidemia, mixed 04/17/2017   Memory loss 01/06/2016    Dizziness and giddiness 01/06/2016   Syncope, near 04/03/2015   Eczema 06/29/2013    Past Surgical History:  Procedure Laterality Date   APPENDECTOMY     surgery on finger         Home Medications    Prior to Admission medications   Medication Sig Start Date End Date Taking? Authorizing Provider  mineral oil-hydrophilic petrolatum (AQUAPHOR) ointment Apply topically as needed for dry skin. 02/14/23  Yes Carlisle Beers, FNP  predniSONE (DELTASONE) 20 MG tablet Take 2 tablets (40 mg total) by mouth daily for 5 days. 02/14/23 02/19/23 Yes Carlisle Beers, FNP  tamsulosin (FLOMAX) 0.4 MG CAPS capsule Take 0.4 mg by mouth daily. 11/07/21   [provider]    Family History Family History  Problem Relation Age of Onset   Throat cancer Mother        laryngeal cancer   Stomach cancer Brother 74   Dementia Sister 76    Social History Social History   Tobacco Use   Smoking status: Never   Smokeless tobacco: Never  Vaping Use   Vaping status: Never Used  Substance Use Topics   Alcohol use: No    Alcohol/week: 0.0 standard drinks of alcohol   Drug use: No     Allergies   Patient has no known allergies.   Review of Systems Review of Systems  Constitutional:  Negative for fatigue and fever.  HENT:  Negative for hoarse voice and sore throat.   Gastrointestinal:  Negative  for abdominal pain, diarrhea, nausea and vomiting.  Musculoskeletal:  Negative for arthralgias and myalgias.  Skin:  Positive for rash.  Neurological:  Negative for headaches.  Per HPI   Physical Exam Triage Vital Signs ED Triage Vitals  Encounter Vitals Group     BP 02/14/23 1405 114/73     Systolic BP Percentile --      Diastolic BP Percentile --      Pulse Rate 02/14/23 1405 65     Resp 02/14/23 1405 16     Temp 02/14/23 1405 97.8 F (36.6 C)     Temp Source 02/14/23 1405 Oral     SpO2 02/14/23 1405 94 %     Weight 02/14/23 1404 160 lb (72.6 kg)     Height 02/14/23  1404 5' 5.75" (1.67 m)     Head Circumference --      Peak Flow --      Pain Score 02/14/23 1403 0     Pain Loc --      Pain Education --      Exclude from Growth Chart --    No data found.  Updated Vital Signs BP 114/73 (BP Location: Right Arm)   Pulse 65   Temp 97.8 F (36.6 C) (Oral)   Resp 16   Ht 5' 5.75" (1.67 m)   Wt 160 lb (72.6 kg)   SpO2 94%   BMI 26.02 kg/m   Visual Acuity Right Eye Distance:   Left Eye Distance:   Bilateral Distance:    Right Eye Near:   Left Eye Near:    Bilateral Near:     Physical Exam Vitals and nursing note reviewed.  Constitutional:      Appearance: He is not ill-appearing or toxic-appearing.  HENT:     Head: Normocephalic and atraumatic.     Right Ear: Hearing and external ear normal.     Left Ear: Hearing and external ear normal.     Nose: Nose normal.     Mouth/Throat:     Lips: Pink.  Eyes:     General: Lids are normal. Vision grossly intact. Gaze aligned appropriately.     Extraocular Movements: Extraocular movements intact.     Conjunctiva/sclera: Conjunctivae normal.  Pulmonary:     Effort: Pulmonary effort is normal.  Musculoskeletal:     Cervical back: Neck supple.  Skin:    General: Skin is warm and dry.     Capillary Refill: Capillary refill takes less than 2 seconds.     Findings: Rash (diffuse maculopapular erythematous rash most present to the extremities bilaterally with some dispersed lesions throughout the trunk, upper back, and chest wall/abdomen. Evidence of skin excoriation to the upper right back without signs of infection) present.  Neurological:     General: No focal deficit present.     Mental Status: He is alert and oriented to person, place, and time. Mental status is at baseline.     Cranial Nerves: No dysarthria or facial asymmetry.  Psychiatric:        Mood and Affect: Mood normal.        Speech: Speech normal.        Behavior: Behavior normal.        Thought Content: Thought content normal.         Judgment: Judgment normal.     UC Treatments / Results  Labs (all labs ordered are listed, but only abnormal results are displayed) Labs Reviewed - No data to display  EKG  Radiology No results found.  Procedures Procedures (including critical care time)  Medications Ordered in UC Medications - No data to display  Initial Impression / Assessment and Plan / UC Course  I have reviewed the triage vital signs and the nursing notes.  Pertinent labs & imaging results that were available during my care of the patient were reviewed by me and considered in my medical decision making (see chart for details).   1. Rash and nonspecific skin eruption Presentation suspicious for inflammatory rash without signs of secondary bacterial infection. Will treat acute flare of rash with prednisone 40mg  every day for 5 days, Aquaphor emollient. Reviewed most recent blood work (August 2024 CMP) showing stable renal function. Take prednisone with food to avoid stomach upset. Advised to avoid scented laundry detergent, switch to free and clear detergent and avoid allergens. Walking referral to dermatology provided for follow-up and further evaluation. May also follow-up with PCP as needed.   Counseled patient on potential for adverse effects with medications prescribed/recommended today, strict ER and return-to-clinic precautions discussed, patient verbalized understanding.    Final Clinical Impressions(s) / UC Diagnoses   Final diagnoses:  Rash and nonspecific skin eruption     Discharge Instructions      Take prednisone to calm down rash as prescribed once a day for 5 days. Take this medicine with food to avoid stomach upset. Follow-up with dermatologist soon as well as PCP. Use free and clear laundry detergent.  If you develop any new or worsening symptoms or if your symptoms do not start to improve, please return here or follow-up with your primary care provider. If your symptoms are  severe, please go to the emergency room.    ED Prescriptions     Medication Sig Dispense Auth. Provider   predniSONE (DELTASONE) 20 MG tablet Take 2 tablets (40 mg total) by mouth daily for 5 days. 10 tablet Reita May M, FNP   mineral oil-hydrophilic petrolatum (AQUAPHOR) ointment Apply topically as needed for dry skin. 420 g Carlisle Beers, FNP      PDMP not reviewed this encounter.   Carlisle Beers, [place] 02/14/23 1920

## 2023-02-14 NOTE — ED Triage Notes (Signed)
Patient here today with c/o itchy rash all over body that has been worsening over the last 3 weeks. Patient states that he has had this same problem for 20-30 years. He does take some advil which seems to help some.

## 2023-02-14 NOTE — Discharge Instructions (Addendum)
Take prednisone to calm down rash as prescribed once a day for 5 days. Take this medicine with food to avoid stomach upset. Follow-up with dermatologist soon as well as PCP. Use free and clear laundry detergent.  If you develop any new or worsening symptoms or if your symptoms do not start to improve, please return here or follow-up with your primary care provider. If your symptoms are severe, please go to the emergency room.

## 2023-04-28 ENCOUNTER — Ambulatory Visit: Payer: 59 | Admitting: Neurology

## 2023-04-28 ENCOUNTER — Encounter: Payer: Self-pay | Admitting: Neurology

## 2023-06-16 ENCOUNTER — Ambulatory Visit (HOSPITAL_COMMUNITY)
Admission: EM | Admit: 2023-06-16 | Discharge: 2023-06-16 | Disposition: A | Discharge status: Home / Self Care | Payer: 59

## 2023-06-16 ENCOUNTER — Encounter (HOSPITAL_COMMUNITY): Payer: Self-pay

## 2023-06-16 DIAGNOSIS — J09X2 Influenza due to identified novel influenza A virus with other respiratory manifestations: Secondary | ICD-10-CM | POA: Diagnosis not present

## 2023-06-16 DIAGNOSIS — R531 Weakness: Secondary | ICD-10-CM

## 2023-06-16 LAB — POCT URINALYSIS DIP (MANUAL ENTRY)
Bilirubin, UA: NEGATIVE
Glucose, UA: NEGATIVE mg/dL
Ketones, POC UA: NEGATIVE mg/dL
Leukocytes, UA: NEGATIVE
Nitrite, UA: NEGATIVE
Protein Ur, POC: 30 mg/dL — AB
Spec Grav, UA: 1.025 (ref 1.010–1.025)
Urobilinogen, UA: 0.2 U/dL
pH, UA: 5.5 (ref 5.0–8.0)

## 2023-06-16 LAB — POC COVID19/FLU A&B COMBO
Covid Antigen, POC: NEGATIVE
Influenza A Antigen, POC: POSITIVE — AB
Influenza B Antigen, POC: NEGATIVE

## 2023-06-16 MED ORDER — OSELTAMIVIR PHOSPHATE 75 MG PO CAPS: 75.0000 mg | ORAL_CAPSULE | Freq: Two times a day (BID) | ORAL | 0 refills | Status: DC

## 2023-06-16 MED ORDER — BENZONATATE 100 MG PO CAPS: 100.0000 mg | ORAL_CAPSULE | Freq: Three times a day (TID) | ORAL | 0 refills | Status: DC

## 2023-06-16 NOTE — ED Triage Notes (Addendum)
For a week he has had a cough. Pt took Nyquil the night before. Since yesterday he has had a headache, dizziness, slurred speech, and has had increasingly worse left sided leg weakness. He has struggled with forgetfulness before and had an episode of forgetfulness today as well.   Pt negative for facial droop and drift. Pt had equal grip strength. Pt has been seen for dementia and stroke before and the tests were negative.   Home Interventions: Tylenol, Nyquil

## 2023-06-16 NOTE — ED Provider Notes (Signed)
[facility]    CSN: [id number] Arrival date & time: 06/16/23  1350      History   Chief Complaint Chief Complaint  Patient presents with   Headache   Extremity Weakness    HPI Shane Melton is a 83 y.o. male.   HPI He is in today with his wife for weakness and headache, dizziness. She reports that his memory is altered and they came in for a pill to stop the progression of his memory loss. He is also having  nasal congestion, sneezing, runny nose, cough. This has been going on for 7 days. Exposure negative COVID/Influenza/Strep. Denies Fever, chills, new sore throat, new loss of smell or taste, shortness of breath, chest pain, nausea, or diarrhea. The current treatment has been OTC APAP, and Nyquil.  Past Medical History:  Diagnosis Date   Syncope, near 04/03/2015    Patient Active Problem List   Diagnosis Date Noted   Erectile dysfunction 10/22/2022   Nocturia 10/10/2020   Vitamin D deficiency 01/26/2020   Hyperlipidemia, mixed 04/17/2017   Memory loss 01/06/2016   Dizziness and giddiness 01/06/2016   Syncope, near 04/03/2015   Eczema 06/29/2013    Past Surgical History:  Procedure Laterality Date   APPENDECTOMY     surgery on finger         Home Medications    Prior to Admission medications   Medication Sig Start Date End Date Taking? Authorizing Provider  benzonatate (TESSALON) 100 MG capsule Take 1 capsule (100 mg total) by mouth every 8 (eight) hours. 06/16/23  Yes Barbette Merino, NP  oseltamivir (TAMIFLU) 75 MG capsule Take 1 capsule (75 mg total) by mouth every 12 (twelve) hours. 06/16/23  Yes Barbette Merino, NP  tamsulosin (FLOMAX) 0.4 MG CAPS capsule Take 0.4 mg by mouth daily. 11/07/21  Yes [provider]  mineral oil-hydrophilic petrolatum (AQUAPHOR) ointment Apply topically as needed for dry skin. 02/14/23   Carlisle Beers, FNP    Family History Family History  Problem Relation Age of Onset   Throat cancer Mother         laryngeal cancer   Stomach cancer Brother 88   Dementia Sister 47    Social History Social History   Tobacco Use   Smoking status: Never   Smokeless tobacco: Never  Vaping Use   Vaping status: Never Used  Substance Use Topics   Alcohol use: No    Alcohol/week: 0.0 standard drinks of alcohol   Drug use: No     Allergies   Other   Review of Systems Review of Systems   Physical Exam Triage Vital Signs ED Triage Vitals  Encounter Vitals Group     BP 06/16/23 1640 136/77     Systolic BP Percentile --      Diastolic BP Percentile --      Pulse Rate 06/16/23 1640 78     Resp 06/16/23 1640 18     Temp 06/16/23 1640 99.6 F (37.6 C)     Temp Source 06/16/23 1640 Oral     SpO2 06/16/23 1640 95 %     Weight --      Height --      Head Circumference --      Peak Flow --      Pain Score 06/16/23 1648 0     Pain Loc --      Pain Education --      Exclude from Growth Chart --  No data found.  Updated Vital Signs BP 136/77 (BP Location: Left Arm)   Pulse 78   Temp 99.6 F (37.6 C) (Oral)   Resp 18   SpO2 95%   Visual Acuity Right Eye Distance:   Left Eye Distance:   Bilateral Distance:    Right Eye Near:   Left Eye Near:    Bilateral Near:     Physical Exam Constitutional:      General: He is not in acute distress.    Appearance: He is not diaphoretic.     Comments: weakness  HENT:     Mouth/Throat:     Mouth: Mucous membranes are moist.  Cardiovascular:     Rate and Rhythm: Normal rate and regular rhythm.     Heart sounds: Normal heart sounds.  Pulmonary:     Effort: Pulmonary effort is normal.     Breath sounds: Normal breath sounds.  Musculoskeletal:     Cervical back: Normal range of motion.     Comments: Ambulating to the bathroom without difficulty  Skin:    General: Skin is warm and dry.  Neurological:     Mental Status: He is alert and oriented to person, place, and time.     GCS: GCS eye subscore is 4. GCS verbal subscore is 5.  GCS motor subscore is 6.     Cranial Nerves: No cranial nerve deficit.     Motor: Weakness present.     Gait: Gait abnormal (slow with slight lean maybe baseline).      UC Treatments / Results  Labs (all labs ordered are listed, but only abnormal results are displayed) Labs Reviewed  POC COVID19/FLU A&B COMBO - Abnormal; Notable for the following components:      Result Value   Influenza A Antigen, POC Positive (*)    All other components within normal limits  POCT URINALYSIS DIP (MANUAL ENTRY) - Abnormal; Notable for the following components:   Blood, UA moderate (*)    Protein Ur, POC =30 (*)    All other components within normal limits    EKG   Radiology No results found.  Procedures Procedures (including critical care time)  Medications Ordered in UC Medications - No data to display  Initial Impression / Assessment and Plan / UC Course  I have reviewed the triage vital signs and the nursing notes.  Pertinent labs & imaging results that were available during my care of the patient were reviewed by me and considered in my medical decision making (see chart for details).     Headache Final Clinical Impressions(s) / UC Diagnoses   Final diagnoses:  Influenza due to identified novel influenza A virus with other respiratory manifestations  Weakness     Discharge Instructions      You have been diagnosed with influenza A. You have been prescribed Tamiflu 1 capsule every 12 hours if you decide to start this.  The recommendation is within the 48 to 72 hours.  It has been longer.  However based on your age I will allow you to make the final call.  You have been prescribed benzonatate 100 mg every 8 hours for cough.  The recommendation is for you to discontinue the use of the NyQuil which may be making your weakness worse. You are encouraged to hydrate well with water due to the hematuria that was noted in your urinalysis.  You are also encouraged to drink 100%  cranberry juice approximately 8 ounces over the next week.  You are to follow-up with your primary care provider for preventative medications for memory changes. Due to your weakness you are encouraged not to drive home.       ED Prescriptions     Medication Sig Dispense Auth. Provider   benzonatate (TESSALON) 100 MG capsule Take 1 capsule (100 mg total) by mouth every 8 (eight) hours. 21 capsule Thad Ranger M, NP   oseltamivir (TAMIFLU) 75 MG capsule Take 1 capsule (75 mg total) by mouth every 12 (twelve) hours. 10 capsule Barbette Merino, NP      PDMP not reviewed this encounter.   Thad Ranger [place], [place] 06/20/23 [phone]

## 2023-06-16 NOTE — Discharge Instructions (Addendum)
You have been diagnosed with influenza A. You have been prescribed Tamiflu 1 capsule every 12 hours if you decide to start this.  The recommendation is within the 48 to 72 hours.  It has been longer.  However based on your age I will allow you to make the final call.  You have been prescribed benzonatate 100 mg every 8 hours for cough.  The recommendation is for you to discontinue the use of the NyQuil which may be making your weakness worse. You are encouraged to hydrate well with water due to the hematuria that was noted in your urinalysis.  You are also encouraged to drink 100% cranberry juice approximately 8 ounces over the next week.  You are to follow-up with your primary care provider for preventative medications for memory changes. Due to your weakness you are encouraged not to drive home.

## 2023-06-19 ENCOUNTER — Telehealth: Payer: Self-pay | Admitting: Neurology

## 2023-06-19 NOTE — Telephone Encounter (Signed)
rs appointment

## 2023-08-21 DIAGNOSIS — E782 Mixed hyperlipidemia: Secondary | ICD-10-CM | POA: Diagnosis not present

## 2023-08-21 DIAGNOSIS — E559 Vitamin D deficiency, unspecified: Secondary | ICD-10-CM | POA: Diagnosis not present

## 2023-09-04 DIAGNOSIS — R350 Frequency of micturition: Secondary | ICD-10-CM | POA: Diagnosis not present

## 2023-09-04 DIAGNOSIS — R3915 Urgency of urination: Secondary | ICD-10-CM | POA: Diagnosis not present

## 2023-09-05 ENCOUNTER — Emergency Department (HOSPITAL_COMMUNITY)
Admission: EM | Admit: 2023-09-05 | Discharge: 2023-09-06 | Disposition: A | Discharge status: Home / Self Care | Attending: Emergency Medicine | Admitting: Emergency Medicine

## 2023-09-05 ENCOUNTER — Other Ambulatory Visit: Payer: Self-pay

## 2023-09-05 ENCOUNTER — Encounter (HOSPITAL_COMMUNITY): Payer: Self-pay | Admitting: *Deleted

## 2023-09-05 DIAGNOSIS — K5641 Fecal impaction: Secondary | ICD-10-CM | POA: Diagnosis not present

## 2023-09-05 DIAGNOSIS — R339 Retention of urine, unspecified: Secondary | ICD-10-CM | POA: Insufficient documentation

## 2023-09-05 LAB — CBC WITH DIFFERENTIAL/PLATELET
Abs Immature Granulocytes: 0.04 10*3/uL (ref 0.00–0.07)
Basophils Absolute: 0 10*3/uL (ref 0.0–0.1)
Basophils Relative: 0 %
Eosinophils Absolute: 0 10*3/uL (ref 0.0–0.5)
Eosinophils Relative: 0 %
HCT: 43.8 % (ref 39.0–52.0)
Hemoglobin: 15.3 g/dL (ref 13.0–17.0)
Immature Granulocytes: 0 %
Lymphocytes Relative: 9 %
Lymphs Abs: 1 10*3/uL (ref 0.7–4.0)
MCH: 35.3 pg — ABNORMAL HIGH (ref 26.0–34.0)
MCHC: 34.9 g/dL (ref 30.0–36.0)
MCV: 101.2 fL — ABNORMAL HIGH (ref 80.0–100.0)
Monocytes Absolute: 0.7 10*3/uL (ref 0.1–1.0)
Monocytes Relative: 6 %
Neutro Abs: 9.4 10*3/uL — ABNORMAL HIGH (ref 1.7–7.7)
Neutrophils Relative %: 85 %
Platelets: 169 10*3/uL (ref 150–400)
RBC: 4.33 MIL/uL (ref 4.22–5.81)
RDW: 13 % (ref 11.5–15.5)
WBC: 11.2 10*3/uL — ABNORMAL HIGH (ref 4.0–10.5)
nRBC: 0 % (ref 0.0–0.2)

## 2023-09-05 LAB — URINALYSIS, ROUTINE W REFLEX MICROSCOPIC
Bilirubin Urine: NEGATIVE
Glucose, UA: NEGATIVE mg/dL
Ketones, ur: 5 mg/dL — AB
Leukocytes,Ua: NEGATIVE
Nitrite: NEGATIVE
Protein, ur: NEGATIVE mg/dL
Specific Gravity, Urine: 1.011 (ref 1.005–1.030)
pH: 5 (ref 5.0–8.0)

## 2023-09-05 LAB — BASIC METABOLIC PANEL WITH GFR
Anion gap: 13 (ref 5–15)
BUN: 19 mg/dL (ref 8–23)
CO2: 21 mmol/L — ABNORMAL LOW (ref 22–32)
Calcium: 9.5 mg/dL (ref 8.9–10.3)
Chloride: 99 mmol/L (ref 98–111)
Creatinine, Ser: 1.11 mg/dL (ref 0.61–1.24)
GFR, Estimated: >60 mL/min (ref >60)
Glucose, Bld: 122 mg/dL — ABNORMAL HIGH (ref 70–99)
Potassium: 3.9 mmol/L (ref 3.5–5.1)
Sodium: 133 mmol/L — ABNORMAL LOW (ref 135–145)

## 2023-09-05 MED ORDER — PSYLLIUM 58.6 % PO POWD: 1.0000 | Freq: Three times a day (TID) | ORAL | 1 refills | Status: AC

## 2023-09-05 MED ORDER — FLEET ENEMA RE ENEM
1.0000 | ENEMA | Freq: Once | RECTAL | Status: AC
  Administered 2023-09-05: 1 via RECTAL
  Filled 2023-09-05: qty 1

## 2023-09-05 MED ORDER — POLYETHYLENE GLYCOL 3350 17 G PO PACK: 17.0000 g | PACK | Freq: Every day | ORAL | 0 refills | Status: AC

## 2023-09-05 NOTE — ED Provider Notes (Signed)
 [facility] Provider Note   CSN: [id number] Arrival date & time: 09/05/23  1754     History  Chief Complaint  Patient presents with   Rectal Pain   Urinary Retention    Shane Melton is a 83 y.o. male.  Patient to ED with complaint of difficulty urinating since this morning. He states for the past 3 months he has had progressive symptoms of urinary frequency but unable to produce much urine when he attempts to empty his bladder. No hematuria, fever, nausea. History of prostate enlargement for which he takes daily medication (?flomax). He also reports constipation with last bowel movement 2 days ago. He feels there is stool in the rectum but he is unable to push it out. No bleeding. History of constipation.   The history is provided by the patient, the spouse and a relative. A language interpreter was used (Daughter at bedside is translating).       Home Medications Prior to Admission medications   Medication Sig Start Date End Date Taking? Authorizing Provider  benzonatate  (TESSALON ) 100 MG capsule Take 1 capsule (100 mg total) by mouth every 8 (eight) hours. 06/16/23   Gregoria Leas, NP  mineral oil-hydrophilic petrolatum (AQUAPHOR) ointment Apply topically as needed for dry skin. 02/14/23   Starlene Eaton, FNP  oseltamivir  (TAMIFLU ) 75 MG capsule Take 1 capsule (75 mg total) by mouth every 12 (twelve) hours. 06/16/23   Gregoria Leas, NP  tamsulosin (FLOMAX) 0.4 MG CAPS capsule Take 0.4 mg by mouth daily. 11/07/21   [provider]      Allergies    Other    Review of Systems   Review of Systems  Physical Exam Updated Vital Signs BP (!) 168/93 (BP Location: Right Arm)   Pulse 91   Temp 98 F (36.7 C) (Oral)   Resp 18   SpO2 99%  Physical Exam Vitals and nursing note reviewed.  Constitutional:      Appearance: He is well-developed.  Pulmonary:     Effort: Pulmonary effort is normal.  Abdominal:      General: There is no distension.     Tenderness: There is abdominal tenderness (Suprapubic).  Genitourinary:    Comments: Large, hard stool in rectum.  Musculoskeletal:        General: Normal range of motion.     Cervical back: Normal range of motion.  Skin:    General: Skin is warm and dry.  Neurological:     Mental Status: He is alert and oriented to person, place, and time.     ED Results / Procedures / Treatments   Labs (all labs ordered are listed, but only abnormal results are displayed) Labs Reviewed  BASIC METABOLIC PANEL WITH GFR - Abnormal; Notable for the following components:      Result Value   Sodium 133 (*)    CO2 21 (*)    Glucose, Bld 122 (*)    All other components within normal limits  CBC WITH DIFFERENTIAL/PLATELET - Abnormal; Notable for the following components:   WBC 11.2 (*)    MCV 101.2 (*)    MCH 35.3 (*)    Neutro Abs 9.4 (*)    All other components within normal limits  URINALYSIS, ROUTINE W REFLEX MICROSCOPIC   Results for orders placed or performed during the hospital encounter of 09/05/23  Basic metabolic panel   Collection Time: 09/05/23  8:26 PM  Result Value Ref Range  Sodium 133 (L) 135 - 145 mmol/L   Potassium 3.9 3.5 - 5.1 mmol/L   Chloride 99 98 - 111 mmol/L   CO2 21 (L) 22 - 32 mmol/L   Glucose, Bld 122 (H) 70 - 99 mg/dL   BUN 19 8 - 23 mg/dL   Creatinine, Ser 7.82 0.61 - 1.24 mg/dL   Calcium 9.5 8.9 - 95.6 mg/dL   GFR, Estimated >21 >30 mL/min   Anion gap 13 5 - 15  CBC with Differential   Collection Time: 09/05/23  8:26 PM  Result Value Ref Range   WBC 11.2 (H) 4.0 - 10.5 K/uL   RBC 4.33 4.22 - 5.81 MIL/uL   Hemoglobin 15.3 13.0 - 17.0 g/dL   HCT 86.5 78.4 - 69.6 %   MCV 101.2 (H) 80.0 - 100.0 fL   MCH 35.3 (H) 26.0 - 34.0 pg   MCHC 34.9 30.0 - 36.0 g/dL   RDW 29.5 28.4 - 13.2 %   Platelets 169 150 - 400 K/uL   nRBC 0.0 0.0 - 0.2 %   Neutrophils Relative % 85 %   Neutro Abs 9.4 (H) 1.7 - 7.7 K/uL   Lymphocytes  Relative 9 %   Lymphs Abs 1.0 0.7 - 4.0 K/uL   Monocytes Relative 6 %   Monocytes Absolute 0.7 0.1 - 1.0 K/uL   Eosinophils Relative 0 %   Eosinophils Absolute 0.0 0.0 - 0.5 K/uL   Basophils Relative 0 %   Basophils Absolute 0.0 0.0 - 0.1 K/uL   Immature Granulocytes 0 %   Abs Immature Granulocytes 0.04 0.00 - 0.07 K/uL    EKG None  Radiology No results found.  Procedures .Fecal disimpaction  Date/Time: 09/05/2023 9:41 PM  Performed by: Mandy Second, PA-C Authorized by: Mandy Second, PA-C  Consent: Verbal consent obtained. Consent given by: patient Patient identity confirmed: verbally with patient Local anesthesia used: no  Anesthesia: Local anesthesia used: no  Sedation: Patient sedated: no  Patient tolerance: patient tolerated the procedure well with no immediate complications Comments: Large amount of brown stool removed from rectum.        Medications Ordered in ED Medications  sodium phosphate (FLEET) enema 1 enema (has no administration in time range)    ED Course/ Medical Decision Making/ A&P Clinical Course as of 09/05/23 2205  Fri Sep 05, 2023  2142 Here with unable to urinate since this morning. History of prostate issues and trouble with bladder emptying followed by urology. Foley inserted with return of 1100 cc yellow urine. Also with fecal impaction with successful disimpaction. No concerning lab abnormalities.  [SU]    Clinical Course User Index [SU] Mandy Second, PA-C                                 Medical Decision Making This patient presents to the ED for concern of urinary retention, this involves an extensive number of treatment options, and is a complaint that carries with it a high risk of complications and morbidity.  The differential diagnosis includes urethral obstruction (mass, inflammation, stone), prostate hypertrophy, bladder/prostate CA, UTI  Also complains of constipation: DDx: obstruction, fecal impaction, colonic mass,    Co morbidities that complicate the patient evaluation  Prostate issues   Additional history obtained:  Additional history and/or information obtained from chart review, notable for daughter at bedside   Lab Tests:  I Ordered, and personally interpreted labs.  The pertinent results include:  ZOX09.6, hgb 15.3; CO2 21, Na133 UA pending at time of sign out to oncoming provider.   Medications provided: Fleet's enema  Test Considered:  Imaging for obstruction - abdomen benign, no leukocytosis, fecal impaction with brown stool - obstuction felt unlikely   Critical Interventions:  N/a   Consultations Obtained:  I requested consultation with the n/a,  and discussed lab and imaging findings as well as pertinent plan - they recommend: n/a   Problem List / ED Course:  Urinary retention - history of progressively worsening urinary frequency and small volume micturition Fecal impaction   Reevaluation:  After the interventions noted above, I reevaluated the patient and found that they have :improved   Social Determinants of Health:  Lives with wife   Disposition:  After consideration of the diagnostic results and the patients response to treatment, I feel that the patient would benefit from - disposition to be determined by Dr. Astrid Lay after Fleet's enema provided. .   Amount and/or Complexity of Data Reviewed Labs: ordered.  Risk OTC drugs.           Final Clinical Impression(s) / ED Diagnoses Final diagnoses:  Fecal impaction [facility])  Urinary retention    Rx / DC Orders ED Discharge Orders     None         Rama Burkitt 09/05/23 2205    Deatra Face, MD 09/05/23 2350

## 2023-09-05 NOTE — ED Triage Notes (Signed)
 Pt is here for urinary retention, he voided last at 8am and appears visibly uncomfortable in triage.  Pt also has rectal and per pt there is stool that can be felt in rectum but he is unable to have a BM.  Pt is unable to sit in triage.  Last BM was 2 days ago. Triage using daughter to translate to [place], they decline wally.

## 2023-09-05 NOTE — Discharge Instructions (Addendum)
 You were seen in the ER for constipation and also urinary retention. It is not entirely clear why you had those symptoms and what the root causes.  It is possible that constipation might have caused urinary retention or the other way around.  For urinary retention, we have placed a Foley catheter.  We recommend that you call your prostate doctor for a follow-up within 3 to 5 days.  For constipation, please take MiraLAX  and make sure that you are drinking lots of water.  Also start taking Metamucil every day as a fiber supplement. Follow-up with your primary care doctor for optimal constipation management.

## 2023-09-08 ENCOUNTER — Ambulatory Visit: Payer: 59 | Admitting: Neurology

## 2023-09-15 DIAGNOSIS — R350 Frequency of micturition: Secondary | ICD-10-CM | POA: Diagnosis not present

## 2023-10-02 DIAGNOSIS — R300 Dysuria: Secondary | ICD-10-CM | POA: Diagnosis not present

## 2023-10-02 DIAGNOSIS — N3000 Acute cystitis without hematuria: Secondary | ICD-10-CM | POA: Diagnosis not present

## 2023-10-23 DIAGNOSIS — R3129 Other microscopic hematuria: Secondary | ICD-10-CM | POA: Diagnosis not present

## 2023-10-23 DIAGNOSIS — R300 Dysuria: Secondary | ICD-10-CM | POA: Diagnosis not present

## 2023-10-23 DIAGNOSIS — R350 Frequency of micturition: Secondary | ICD-10-CM | POA: Diagnosis not present

## 2023-10-23 DIAGNOSIS — R3915 Urgency of urination: Secondary | ICD-10-CM | POA: Diagnosis not present

## 2023-10-23 DIAGNOSIS — R3912 Poor urinary stream: Secondary | ICD-10-CM | POA: Diagnosis not present

## 2023-11-17 ENCOUNTER — Ambulatory Visit (HOSPITAL_COMMUNITY)
Admission: EM | Admit: 2023-11-17 | Discharge: 2023-11-17 | Disposition: A | Discharge status: Home / Self Care | Attending: Internal Medicine | Admitting: Internal Medicine

## 2023-11-17 ENCOUNTER — Encounter (HOSPITAL_COMMUNITY): Payer: Self-pay | Admitting: Emergency Medicine

## 2023-11-17 DIAGNOSIS — H00014 Hordeolum externum left upper eyelid: Secondary | ICD-10-CM | POA: Diagnosis not present

## 2023-11-17 MED ORDER — ERYTHROMYCIN 5 MG/GM OP OINT: TOPICAL_OINTMENT | OPHTHALMIC | 0 refills | Status: DC

## 2023-11-17 NOTE — ED Provider Notes (Signed)
 [facility]    CSN: [id number] Arrival date & time: 11/17/23  0806      History   Chief Complaint Chief Complaint  Patient presents with   Facial Swelling    HPI Shane Melton is a 83 y.o. male.   Shane Melton is a 84 y.o. male with history of memory loss, eczema, and hypertriglyceridemia presenting with wife who contributes to the history for chief complaint of swelling to the left upper eyelid that started a week ago. The left upper eyelid is minimally itchy and swollen. Denies recent trauma/injury to the left eye, watery/crusty drainage from the left eye, and pain/redness to the left eyeball itself. He does not wear contacts or glasses for vision correction.  Denies ear pain, dizziness, viral URI symptoms, fever/chills, redness to the area of swelling of the upper left eyelid, and rash.  No visual disturbances reported. He has not attempted use of any OTC medications for symptoms PTA.   The history is provided by the patient, medical records and the spouse. The history is limited by a language barrier. A language interpreter was used [place] medical interpreter used for entirety of patient encounter.).    Past Medical History:  Diagnosis Date   Syncope, near 04/03/2015    Patient Active Problem List   Diagnosis Date Noted   Erectile dysfunction 10/22/2022   Nocturia 10/10/2020   Vitamin D deficiency 01/26/2020   Hyperlipidemia, mixed 04/17/2017   Memory loss 01/06/2016   Dizziness and giddiness 01/06/2016   Syncope, near 04/03/2015   Eczema 06/29/2013    Past Surgical History:  Procedure Laterality Date   APPENDECTOMY     surgery on finger         Home Medications    Prior to Admission medications   Medication Sig Start Date End Date Taking? Authorizing Provider  erythromycin  ophthalmic ointment Place a 1/2 inch ribbon of ointment into the lower eyelid every 12 hours for the next 7 days. 11/17/23  Yes Enedelia Dorna HERO, FNP  benzonatate   (TESSALON ) 100 MG capsule Take 1 capsule (100 mg total) by mouth every 8 (eight) hours. 06/16/23   Myrna Camelia HERO, NP  mineral oil-hydrophilic petrolatum (AQUAPHOR) ointment Apply topically as needed for dry skin. 02/14/23   Enedelia Dorna HERO, FNP  oseltamivir  (TAMIFLU ) 75 MG capsule Take 1 capsule (75 mg total) by mouth every 12 (twelve) hours. 06/16/23   Myrna Camelia HERO, NP  polyethylene glycol (MIRALAX ) 17 g packet Take 17 g by mouth daily. 09/05/23   Charlyn Sora, MD  psyllium (METAMUCIL) 58.6 % powder Take 1 packet by mouth 3 (three) times daily. 09/05/23   Nanavati, Ankit, MD  tamsulosin (FLOMAX) 0.4 MG CAPS capsule Take 0.4 mg by mouth daily. 11/07/21   [provider]    Family History Family History  Problem Relation Age of Onset   Throat cancer Mother        laryngeal cancer   Stomach cancer Brother 67   Dementia Sister 81    Social History Social History   Tobacco Use   Smoking status: Never   Smokeless tobacco: Never  Vaping Use   Vaping status: Never Used  Substance Use Topics   Alcohol use: No    Alcohol/week: 0.0 standard drinks of alcohol   Drug use: No     Allergies   Other   Review of Systems Review of Systems Per HPI  Physical Exam Triage Vital Signs ED Triage Vitals  Encounter Vitals Group  BP 11/17/23 0838 123/72     Girls Systolic BP Percentile --      Girls Diastolic BP Percentile --      Boys Systolic BP Percentile --      Boys Diastolic BP Percentile --      Pulse Rate 11/17/23 0838 74     Resp 11/17/23 0838 17     Temp 11/17/23 0838 98.1 F (36.7 C)     Temp Source 11/17/23 0838 Oral     SpO2 11/17/23 0838 96 %     Weight --      Height --      Head Circumference --      Peak Flow --      Pain Score 11/17/23 0836 0     Pain Loc --      Pain Education --      Exclude from Growth Chart --    No data found.  Updated Vital Signs BP 123/72 (BP Location: Left Arm)   Pulse 74   Temp 98.1 F (36.7 C) (Oral)   Resp  17   SpO2 96%   Visual Acuity Right Eye Distance:   Left Eye Distance:   Bilateral Distance:    Right Eye Near:   Left Eye Near:    Bilateral Near:     Physical Exam Vitals and nursing note reviewed.  Constitutional:      Appearance: He is not ill-appearing or toxic-appearing.  HENT:     Head: Normocephalic and atraumatic.     Right Ear: Hearing and external ear normal.     Left Ear: Hearing and external ear normal.     Nose: Nose normal.     Mouth/Throat:     Lips: Pink.   Eyes:     General: Lids are normal. Vision grossly intact. Gaze aligned appropriately. No allergic shiner, visual field deficit or scleral icterus.       Right eye: No foreign body, discharge or hordeolum.        Left eye: Hordeolum present.No foreign body or discharge.     Extraocular Movements: Extraocular movements intact.     Conjunctiva/sclera: Conjunctivae normal.     Right eye: Right conjunctiva is not injected. No chemosis, exudate or hemorrhage.    Left eye: Left conjunctiva is not injected. No chemosis, exudate or hemorrhage.    Pupils: Pupils are equal, round, and reactive to light.     Comments: EOMs intact without pain or dizziness elicited.    Cardiovascular:     Rate and Rhythm: Normal rate and regular rhythm.     Heart sounds: Normal heart sounds, S1 normal and S2 normal.  Pulmonary:     Effort: Pulmonary effort is normal. No respiratory distress.     Breath sounds: Normal breath sounds and air entry.   Musculoskeletal:     Cervical back: Neck supple.  Lymphadenopathy:     Cervical: No cervical adenopathy.   Skin:    General: Skin is warm and dry.     Capillary Refill: Capillary refill takes less than 2 seconds.     Findings: No rash.   Neurological:     General: No focal deficit present.     Mental Status: He is alert and oriented to person, place, and time. Mental status is at baseline.     Cranial Nerves: No dysarthria or facial asymmetry.   Psychiatric:        Mood and  Affect: Mood normal.  Speech: Speech normal.        Behavior: Behavior normal.        Thought Content: Thought content normal.        Judgment: Judgment normal.    Left upper eyelid swelling/hordeolum   UC Treatments / Results  Labs (all labs ordered are listed, but only abnormal results are displayed) Labs Reviewed - No data to display  EKG   Radiology No results found.  Procedures Procedures (including critical care time)  Medications Ordered in UC Medications - No data to display  Initial Impression / Assessment and Plan / UC Course  I have reviewed the triage vital signs and the nursing notes.  Pertinent labs & imaging results that were available during my care of the patient were reviewed by me and considered in my medical decision making (see chart for details).   1. Hordeolum externum of left upper eyelid Hordeolum externum of left upper eyelid on exam.  No signs of preseptal/orbital cellulitis, no signs of conjunctivitis. Low suspicion for corneal abrasion given atraumatic presentation, no drainage/crusting to the eye.  I'd like to cover for bacterial infection with erythromycin  eye ointment to the left eye every 12 hours for the next 7 days, warm compresses as needed for eye swelling/pain, and follow-up with PCP as needed.   Counseled patient on potential for adverse effects with medications prescribed/recommended today, strict ER and return-to-clinic precautions discussed, patient verbalized understanding.    Final Clinical Impressions(s) / UC Diagnoses   Final diagnoses:  Hordeolum externum of left upper eyelid     Discharge Instructions      You have a stye to your left eye.  - Use antibiotic eye medication sent to pharmacy as directed.  - Change your pillowcase after 2 to 3 days to avoid reinfection.  - You may take Tylenol  every 6 hours as needed for any pain you may have.  - Avoid scratching your eye.  Wash your hands frequently to avoid  spread of infection to others.  Perform warm compresses to your eye before applying the eye medication.  If you develop any new or worsening symptoms or do not improve in the next 2 to 3 days, please return.  If your symptoms are severe, please go to the emergency room.  Follow-up with your primary care provider for further evaluation and management of your symptoms as well as ongoing wellness visits.  I hope you feel better!    ED Prescriptions     Medication Sig Dispense Auth. Provider   erythromycin  ophthalmic ointment Place a 1/2 inch ribbon of ointment into the lower eyelid every 12 hours for the next 7 days. 3.5 g Enedelia Dorna HERO, FNP      PDMP not reviewed this encounter.   Enedelia Dorna HERO, [place] 11/18/23 1116

## 2023-11-17 NOTE — Discharge Instructions (Addendum)
 You have a stye to your left eye.  - Use antibiotic eye medication sent to pharmacy as directed.  - Change your pillowcase after 2 to 3 days to avoid reinfection.  - You may take Tylenol  every 6 hours as needed for any pain you may have.  - Avoid scratching your eye.  Wash your hands frequently to avoid spread of infection to others.  Perform warm compresses to your eye before applying the eye medication.  If you develop any new or worsening symptoms or do not improve in the next 2 to 3 days, please return.  If your symptoms are severe, please go to the emergency room.  Follow-up with your primary care provider for further evaluation and management of your symptoms as well as ongoing wellness visits.  I hope you feel better!

## 2023-11-17 NOTE — ED Triage Notes (Signed)
 Week ago when woke up left eye lid was swollen and itching. Reports still persisting. Reports after touching grass or plant with eye is when this started. Denies pain. Reports can't see well--blurred vision.

## 2023-12-04 DIAGNOSIS — R3129 Other microscopic hematuria: Secondary | ICD-10-CM | POA: Diagnosis not present

## 2023-12-04 DIAGNOSIS — R300 Dysuria: Secondary | ICD-10-CM | POA: Diagnosis not present

## 2023-12-09 DIAGNOSIS — E559 Vitamin D deficiency, unspecified: Secondary | ICD-10-CM | POA: Diagnosis not present

## 2023-12-09 DIAGNOSIS — E782 Mixed hyperlipidemia: Secondary | ICD-10-CM | POA: Diagnosis not present

## 2023-12-15 ENCOUNTER — Ambulatory Visit (HOSPITAL_COMMUNITY): Admission: EM | Admit: 2023-12-15 | Discharge: 2023-12-15 | Disposition: A | Discharge status: Home / Self Care

## 2023-12-15 ENCOUNTER — Encounter (HOSPITAL_COMMUNITY): Payer: Self-pay | Admitting: Emergency Medicine

## 2023-12-15 DIAGNOSIS — H1013 Acute atopic conjunctivitis, bilateral: Secondary | ICD-10-CM

## 2023-12-15 MED ORDER — AZELASTINE HCL 0.05 % OP SOLN: 1.0000 [drp] | Freq: Two times a day (BID) | OPHTHALMIC | 0 refills | Status: DC

## 2023-12-15 NOTE — Discharge Instructions (Addendum)
  1. Allergic conjunctivitis of both eyes (Primary) - azelastine  (OPTIVAR ) 0.05 % ophthalmic solution; Place 1 drop into both eyes 2 (two) times daily.  Dispense: 6 mL; Refill: 0 - As discussed continue with regular treatment for right lower eyelid hordeolum (stye). - Apply gentle pressure and massage right lower eyelid over the area of the stye 2-3 times a day. - Apply heat with warm wet washcloth over the eye and gently massage the area. - Use baby shampoo and warm water to massage and clean the area to eliminate any dirt or debris that may be causing blockage. - Continue to monitor symptoms for any changes.  If there is any escalation of current symptoms or development of new symptoms follow-up for further evaluation management.  1. ?? ? ????? ??? (???) - ?????(???) 0.05% ???; 1? 2?, ?? ?? 1??? ?????. ???: 6mL; ???: 0 - ??? ??? ?? ??? ?? ??? ???(???)? ?? ???? ??? ?????. - ??? ??? ??? ?? ???? ???? ??? ??????. ?? 2~3?. - ??? ?? ?? ???? ? ?? ?? ??? ?? ??? ???? ??????. - ??? ??? ??? ?? ???? ?? ??? ????? ??? ?? ??? ??? ? ? ?? ??? ???? ?????. - ??? ??? ????? ???????. ?? ??? ????? ??? ??? ???? ?? ?? ? ??? ?? ?? ?????.

## 2023-12-15 NOTE — ED Provider Notes (Signed)
 [facility]  Note:  This document was prepared using Dragon voice recognition software and may include unintentional dictation errors.  MRN: [id number] DOB: 04/17/1941  Subjective:   Shane Melton is a 83 y.o. male presenting for bilateral eye itching, lower eyelid swelling, and mucus discharge first thing in the morning.  Patient reports that he was seen recently for a stye to his right lower eyelid but states that symptoms have slightly improved without treatment.  Patient is concerned because each morning he wakes up and his eyes are covered in mucus and he has significant under eye swelling and itching.  Patient denies taking any over-the-counter medication or treating with any eyedrops.  Patient denies any past history of seasonal allergies.  Patient has no eye pain, pain with eye movement, photophobia, or vision changes.  Patient states that mucus makes eyes slightly blurry but is improved by cleaning eyes.  No current facility-administered medications for this encounter.  Current Outpatient Medications:    azelastine  (OPTIVAR ) 0.05 % ophthalmic solution, Place 1 drop into both eyes 2 (two) times daily., Disp: 6 mL, Rfl: 0   benzonatate  (TESSALON ) 100 MG capsule, Take 1 capsule (100 mg total) by mouth every 8 (eight) hours., Disp: 21 capsule, Rfl: 0   erythromycin  ophthalmic ointment, Place a 1/2 inch ribbon of ointment into the lower eyelid every 12 hours for the next 7 days., Disp: 3.5 g, Rfl: 0   mineral oil-hydrophilic petrolatum (AQUAPHOR) ointment, Apply topically as needed for dry skin., Disp: 420 g, Rfl: 0   oseltamivir  (TAMIFLU ) 75 MG capsule, Take 1 capsule (75 mg total) by mouth every 12 (twelve) hours., Disp: 10 capsule, Rfl: 0   polyethylene glycol (MIRALAX ) 17 g packet, Take 17 g by mouth daily., Disp: 14 each, Rfl: 0   psyllium (METAMUCIL) 58.6 % powder, Take 1 packet by mouth 3 (three) times daily., Disp: 283 g, Rfl: 1   tamsulosin (FLOMAX) 0.4 MG CAPS  capsule, Take 0.4 mg by mouth daily., Disp: , Rfl:    Allergies  Allergen Reactions   Other Itching    Meat    Past Medical History:  Diagnosis Date   Syncope, near 04/03/2015     Past Surgical History:  Procedure Laterality Date   APPENDECTOMY     surgery on finger      Family History  Problem Relation Age of Onset   Throat cancer Mother        laryngeal cancer   Stomach cancer Brother 26   Dementia Sister 38    Social History   Tobacco Use   Smoking status: Never   Smokeless tobacco: Never  Vaping Use   Vaping status: Never Used  Substance Use Topics   Alcohol use: No    Alcohol/week: 0.0 standard drinks of alcohol   Drug use: No    ROS Refer to HPI for ROS details.  Objective:   Vitals: BP 115/69 (BP Location: Left Arm)   Pulse 67   Temp 97.7 F (36.5 C) (Oral)   Resp 16   SpO2 95%   Physical Exam Vitals and nursing note reviewed.  Constitutional:      General: He is not in acute distress.    Appearance: Normal appearance. He is well-developed. He is not ill-appearing or toxic-appearing.  HENT:     Head: Normocephalic.  Eyes:     General: Lids are normal. Vision grossly intact. Gaze aligned appropriately. No visual field deficit.       Right eye: Hordeolum  present. No discharge.        Left eye: No discharge or hordeolum.     Extraocular Movements:     Right eye: Normal extraocular motion.     Left eye: Normal extraocular motion.     Conjunctiva/sclera:     Right eye: Right conjunctiva is not injected. No exudate or hemorrhage.    Left eye: Left conjunctiva is not injected. No exudate or hemorrhage. Cardiovascular:     Rate and Rhythm: Normal rate.  Pulmonary:     Effort: Pulmonary effort is normal. No respiratory distress.  Skin:    General: Skin is warm and dry.  Neurological:     General: No focal deficit present.     Mental Status: He is alert and oriented to person, place, and time.  Psychiatric:        Mood and Affect: Mood  normal.        Behavior: Behavior normal.     Procedures  No results found for this or any previous visit (from the past 24 hours).  No results found.   Assessment and Plan :     Discharge Instructions       1. Allergic conjunctivitis of both eyes (Primary) - azelastine  (OPTIVAR ) 0.05 % ophthalmic solution; Place 1 drop into both eyes 2 (two) times daily.  Dispense: 6 mL; Refill: 0 - As discussed continue with regular treatment for right lower eyelid hordeolum (stye). - Apply gentle pressure and massage right lower eyelid over the area of the stye 2-3 times a day. - Apply heat with warm wet washcloth over the eye and gently massage the area. - Use baby shampoo and warm water to massage and clean the area to eliminate any dirt or debris that may be causing blockage. - Continue to monitor symptoms for any changes.  If there is any escalation of current symptoms or development of new symptoms follow-up for further evaluation management.    Paislee Szatkowski B Denim Start   Blanch Stang, [place] [facility], [place] 12/15/23 1050

## 2023-12-15 NOTE — ED Triage Notes (Signed)
 Pt presents to UC with c/o bilateral eye drainage, itching and irritation when waking up in the morning. Report the left eye is worse. Denies pain. Reports blurry vision. Was seen on 6/30 for same.

## 2024-03-03 ENCOUNTER — Ambulatory Visit (INDEPENDENT_AMBULATORY_CARE_PROVIDER_SITE_OTHER): Admitting: Podiatry

## 2024-03-03 ENCOUNTER — Encounter: Payer: Self-pay | Admitting: Podiatry

## 2024-03-03 DIAGNOSIS — M79674 Pain in right toe(s): Secondary | ICD-10-CM

## 2024-03-03 DIAGNOSIS — B351 Tinea unguium: Secondary | ICD-10-CM

## 2024-03-03 DIAGNOSIS — L600 Ingrowing nail: Secondary | ICD-10-CM

## 2024-03-03 DIAGNOSIS — M79675 Pain in left toe(s): Secondary | ICD-10-CM

## 2024-03-03 NOTE — Progress Notes (Signed)
 Subjective:   Patient ID: Shane Melton, male   DOB: 83 y.o.   MRN: [id number]   HPI Patient presents with interpreter with pain in both the left and right foot mostly the second nail left third nail right with incurvation stating it has been hurting for around 6 months.  Patient states that he does not remember specific injury does not speak good [place] and does not smoke likes to be active   Review of Systems  All other systems reviewed and are negative.       Objective:  Physical Exam Vitals and nursing note reviewed.  Constitutional:      Appearance: He is well-developed.  Pulmonary:     Effort: Pulmonary effort is normal.  Musculoskeletal:        General: Normal range of motion.  Skin:    General: Skin is warm.  Neurological:     Mental Status: He is alert.     Neurovascular status intact muscle strength adequate range of motion adequate with patient noted to have incurvated second nail left third nail right localized no active drainage no erythema noted.  There is structural damage overall to the nailbeds.  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  Today I went ahead and I discussed the nail disease I reviewed the considerations for nail removal and the surgery and what would be required.  Today I went ahead and I debrided nailbeds 1-5 both feet no iatrogenic bleeding we will keep an eye on this see the response and decide whether nail removal is necessary

## 2024-04-28 ENCOUNTER — Ambulatory Visit (HOSPITAL_COMMUNITY): Admission: EM | Admit: 2024-04-28 | Discharge: 2024-04-28 | Disposition: A | Discharge status: Home / Self Care

## 2024-04-28 ENCOUNTER — Encounter (HOSPITAL_COMMUNITY): Payer: Self-pay

## 2024-04-28 ENCOUNTER — Ambulatory Visit (HOSPITAL_COMMUNITY)

## 2024-04-28 DIAGNOSIS — S39012A Strain of muscle, fascia and tendon of lower back, initial encounter: Secondary | ICD-10-CM | POA: Diagnosis not present

## 2024-04-28 MED ORDER — BACLOFEN 10 MG PO TABS: 10.0000 mg | ORAL_TABLET | Freq: Two times a day (BID) | ORAL | 0 refills | Status: AC

## 2024-04-28 MED ORDER — DICLOFENAC SODIUM 50 MG PO TBEC: 50.0000 mg | DELAYED_RELEASE_TABLET | Freq: Two times a day (BID) | ORAL | 1 refills | Status: AC

## 2024-04-28 MED ORDER — ACETAMINOPHEN 500 MG PO TABS: 1000.0000 mg | ORAL_TABLET | Freq: Four times a day (QID) | ORAL | 0 refills | Status: AC | PRN

## 2024-04-28 NOTE — ED Triage Notes (Signed)
 Patient here today with c/o low back pain that has been worsening over the last week. Patient states that he has a h/o back pain. Patient states that he has bad constipation and was straining while using the bathroom when he felt a pop in his low back a week ago. Patient has been taking something for pain but does not remember the name of it.

## 2024-04-28 NOTE — ED Provider Notes (Signed)
 [facility] [place]  Note:  This document was prepared using Conservation officer, historic buildings and may include unintentional dictation errors.  MRN: [id number] DOB: March 21, 1941  Subjective:   Shane Melton is a 83 y.o. male presenting for right sided lower back pain with mild radiation to the left lower x 1 week.  Patient denies any known injury or trauma but states has a long history of exacerbation.  Patient states that last week he was constipated and was straining to use the bathroom when he felt a pop and has had pain ever since.  Patient has been using pain medication given to him by family member but he does not know the name that he was given.  Patient reports some mild improvement in symptoms with medication but is not taking any over-the-counter medication to treat symptoms.  Patient denies any loss of bowel or bladder function, saddle anesthesia, change in gait pattern or ambulation.  No current facility-administered medications for this encounter.  Current Outpatient Medications:    acetaminophen  (TYLENOL ) 500 MG tablet, Take 2 tablets (1,000 mg total) by mouth every 6 (six) hours as needed., Disp: 30 tablet, Rfl: 0   baclofen (LIORESAL) 10 MG tablet, Take 1 tablet (10 mg total) by mouth 2 (two) times daily., Disp: 30 each, Rfl: 0   diclofenac  (VOLTAREN ) 50 MG EC tablet, Take 1 tablet (50 mg total) by mouth 2 (two) times daily., Disp: 30 tablet, Rfl: 1   finasteride (PROSCAR) 5 MG tablet, Take 5 mg by mouth daily., Disp: , Rfl:    trospium (SANCTURA) 20 MG tablet, Take 20 mg by mouth 2 (two) times daily., Disp: , Rfl:    polyethylene glycol (MIRALAX ) 17 g packet, Take 17 g by mouth daily., Disp: 14 each, Rfl: 0   psyllium (METAMUCIL) 58.6 % powder, Take 1 packet by mouth 3 (three) times daily., Disp: 283 g, Rfl: 1   tamsulosin (FLOMAX) 0.4 MG CAPS capsule, Take 0.4 mg by mouth daily., Disp: , Rfl:    Allergies  Allergen Reactions   Other Itching    Meat    Past  Medical History:  Diagnosis Date   Syncope, near 04/03/2015     Past Surgical History:  Procedure Laterality Date   APPENDECTOMY     surgery on finger      Family History  Problem Relation Age of Onset   Throat cancer Mother        laryngeal cancer   Stomach cancer Brother 40   Dementia Sister 14    Social History   Tobacco Use   Smoking status: Never   Smokeless tobacco: Never  Vaping Use   Vaping status: Never Used  Substance Use Topics   Alcohol use: No    Alcohol/week: 0.0 standard drinks of alcohol   Drug use: No    ROS Refer to HPI for ROS details.  Objective:    Vitals: BP (!) 165/80 (BP Location: Left Arm)   Pulse 69   Temp 97.8 F (36.6 C) (Oral)   Resp 16   SpO2 94%   Physical Exam Vitals and nursing note reviewed.  Constitutional:      General: He is not in acute distress.    Appearance: Normal appearance. He is well-developed. He is not ill-appearing or toxic-appearing.  HENT:     Head: Normocephalic.  Cardiovascular:     Rate and Rhythm: Normal rate.  Pulmonary:     Effort: Pulmonary effort is normal. No respiratory distress.  Breath sounds: No stridor. No wheezing.  Musculoskeletal:     Lumbar back: Spasms and tenderness present. No swelling, deformity or bony tenderness. Decreased range of motion. Negative right straight leg raise test and negative left straight leg raise test.  Skin:    General: Skin is warm and dry.  Neurological:     General: No focal deficit present.     Mental Status: He is alert and oriented to person, place, and time.  Psychiatric:        Mood and Affect: Mood normal.        Behavior: Behavior normal.     Procedures  No results found for this or any previous visit (from the past 24 hours).  Assessment and Plan :     Discharge Instructions       1. 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 - diclofenac  (VOLTAREN ) 50 MG EC tablet; Take 1 tablet (50 mg total) by mouth 2 (two) times daily.  Dispense: 30 tablet; Refill: 1 - baclofen (LIORESAL) 10 MG tablet; Take 1 tablet (10 mg total) by mouth 2 (two) times daily.  Dispense: 30 each; Refill: 0 - acetaminophen  (TYLENOL ) 500 MG tablet; Take 2 tablets (1,000 mg total) by mouth every 6 (six) hours as needed.  Dispense: 30 tablet; Refill: 0  -Continue to monitor symptoms for any change in severity if there is any escalation of current symptoms or development of new symptoms follow-up in ER for further evaluation and management.      Waqas Bruhl B Orvin Netter   Avry Roedl, [place], [place] 04/28/24 [phone]

## 2024-04-28 NOTE — Discharge Instructions (Addendum)
°  1. 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 - diclofenac  (VOLTAREN ) 50 MG EC tablet; Take 1 tablet (50 mg total) by mouth 2 (two) times daily.  Dispense: 30 tablet; Refill: 1 - baclofen (LIORESAL) 10 MG tablet; Take 1 tablet (10 mg total) by mouth 2 (two) times daily.  Dispense: 30 each; Refill: 0 - acetaminophen  (TYLENOL ) 500 MG tablet; Take 2 tablets (1,000 mg total) by mouth every 6 (six) hours as needed.  Dispense: 30 tablet; Refill: 0  -Continue to monitor symptoms for any change in severity if there is any escalation of current symptoms or development of new symptoms follow-up in ER for further evaluation and management.
